# Patient Record
Sex: Female | Born: 1965 | Race: White | Hispanic: No | Marital: Single
Health system: Southern US, Community
[De-identification: ages and names within clinical notes are randomized; demographics above are authoritative.]

## PROBLEM LIST (undated history)

## (undated) DIAGNOSIS — F419 Anxiety disorder, unspecified: Secondary | ICD-10-CM

## (undated) DIAGNOSIS — E119 Type 2 diabetes mellitus without complications: Secondary | ICD-10-CM

## (undated) DIAGNOSIS — I10 Essential (primary) hypertension: Secondary | ICD-10-CM

## (undated) MED ORDER — HYDROCODONE-ACETAMINOPHEN 5 MG-325 MG TAB
5-325 mg | ORAL_TABLET | ORAL | Status: DC | PRN
Start: ? — End: 2013-04-09

## (undated) MED ORDER — NAPROXEN 500 MG TAB
500 mg | ORAL_TABLET | Freq: Two times a day (BID) | ORAL | Status: AC
Start: ? — End: 2013-04-19

## (undated) MED ORDER — OMEPRAZOLE 20 MG CAP, DELAYED RELEASE: 20 mg | ORAL_CAPSULE | Freq: Every day | ORAL | Status: AC

---

## 2012-06-18 NOTE — Procedures (Signed)
Study ID: [id number]                                                      [facility]                                                      [address] [place], [place] [address]                           Dobutamine Stress Echocardiogram Report       Name: Christine [name], Christine [name] Date: 06/19/2012 09:41 AM   MRN: [id number]                      Patient Location: ERO^EO05^EO05^C   DOB: [birth year]                  Age: 46 yrs   BP: 139/77 mmHg                  HR: 54   Gender: Female                   Account #: [id number]   Reason For Study: CHEST PAIN   Ordering Physician: Cipriano Mile, DAVID   Performed By: Dwyane Luo           Interpretation Summary   The Electrocardiographic Interpretation: negative by ECG criteria.   The Echocardiographic Interpretation: hyperkinetic wall motion.   The OverAll Impression : negative for ischemia .   _____________________________________________________________________________   __           Interpretation Summary       Stress Results             Protocol:  Dobutamine             Target HR: 150 bpm           Maximum Predicted HR: 176 bpm                          Stress Duration:   6:18 mm:ss *                      Maximum Stress HR: 153 bpm *       Stress Comments   A stress echocardiogram with Dobutamine was performed. The baseline ECG   displays sinus bradycardia. Arrhythmia induced during stress: occasional   PVC's. Electrocardiographic response was negative by ECG criteria. Test was   terminated due to target heart rate was achieved. Patient developed chest   pain, pain was non-limiting.. B/P Response to exercise: resting hypertension -   appropriate response.  I      WMSI = 1.00     % Normal = 100                                                                   REST                                                                    II      WMSI = 1.00     % Normal = 100                                                                   LOW DOSE                                                                   III      WMSI = 1.00     % Normal = 100                                                                   PEAK                                                                                                                               Segments  Size   X - Cannot                2 -                      4 -          1-2     small   Interpret    1 - Normal   Hypokinetic 3 - Akinetic Dyskinetic   3-5     moder     ate   5 -                                                             6-14    large   Aneurysmal                                                      15-16   diffu   se               Left Ventricle   The left ventricular chamber size at rest is normal. The left ventricular   chamber size at peak stress is smaller. Concentric hypertrophy is seen.       MEASUREMENTS/CALCULATIONS:               Electronically signed byDr. Lauree Chandler, MD   06/19/2012 10:17 AM

## 2012-06-18 NOTE — ED Provider Notes (Signed)
MEDICATION ADMINISTRATION SUMMARY         Drug Name: Mylanta, Dose Ordered: 30 mL , Route: Oral, Status: Given,         Time: 23:25 06/18/2012,         Drug Name: *Tylenol Caplet, Dose Ordered: 650 mg, Route: Oral,         Status: Given, Time: 23:10 06/18/2012,         Drug Name: Morphine Sulfate, Dose Ordered: 4 mg, Route: IV Push,         Status: Given, Time: 15:35 06/18/2012,         Drug Name: Aspirin, Dose Ordered: 325 mg, Route: Oral, Status: Given,         Time: 15:35 06/18/2012, *Additional information available in notes,         Detailed record available in Medication Service section.   KNOWN ALLERGIES   NKDA   OBS PROGRESS NOTE   OBS PROGRESS NOTE: OBS Progress Notes. Pt resting comfortably.         VSS. Denies chest pain. Cont CPP. (18:47 ADB2)     OBS Progress Notes. ce x 3 neg; continue cpp. jh. (21:06 JOHO)     OBS Progress Notes. enz neg x 3. 2/10 pain. no nursing issues per Shawnee Knapp,         Charity fundraiser. Cont CPP. tsn, md. (Sun Jun 19, 2012 04:10 TSN)     OBS Progress Notes. No chest pain, negative enzymes, vitals stable,         continue chest pain protocol. Wynelle Link Jun 19, 2012 05:23 NVS)   Domingo Dimes [phone] Jun 18, 2012 13:39 GCG1)   PATIENT: GENDER: female, DOB: Tue 12/16/1965, TIME OF GREET:         Sat Jun 18, 2012 13:38 by Guy Franco, RN, LANGUAGE: Mila Merry         WEIGHT: 158.8. (Sat Jun 18, 2012 13:39 GCG1)     NAME: Christine [name], Christine L. (14:04 JLB2)   ADMISSION: URGENCY: 2, TRANSPORT: Wheelchair, DEPT: Emergency,         BED: WAITING. (Sat Jun 18, 2012 13:39 GCG1)   COMPLAINT:  Chest Pain. (Sat Jun 18, 2012 13:39 GCG1)   PRESENTING COMPLAINT:  L sided chest pain since 2 hours pta with         shortness of breath; headache. (13:49 GCG1)   PAIN: Patient complains of pain, Pain described as stabbing, On a         scale 0-10 patient rates pain as 8, Pain is constant. (13:49 GCG1)   IMMUNIZATIONS:  Immunizations up to date. (13:49 GCG1)   TREATMENT PRIOR TO ARRIVAL: None. (13:49 GCG1)    TB SCREENING: TB screen not applicable for this patient. (13:49         GCG1)   ABUSE SCREENING: Patient denies physical abuse or threats. (13:49         GCG1)   FALL RISK: Patient has a low risk of falling, Patient has no         history of falling (0), No secondary diagnosis (0), None/bed         rest/nurse assist (0), No IV or IV access (0), Normal/bed         rest/wheelchair (0), Oriented to own ability (0), Total 0. (13:49         GCG1)   SUICIDAL IDEATION: Suicidal ideation is not present. (13:49 GCG1)   ADVANCE DIRECTIVES: Patient does not have advance directives.         (  13:49 GCG1)   PROVIDERS: TRIAGE NURSE: Guy Franco, RN. (Sat Jun 18, 2012         13:39 GCG1)   PRESENTING PROBLEM (13:39 GCG1)      Presenting problems: Chest Pain - Adult.   GREET (13:38 GCG1)   GREET: Greet: Sat Jun 18, 2012 13:38.   CURRENT MEDICATIONS (13:49 GCG1)   Patient not taking meds   MEDICATION SERVICE   Aspirin:  Order: Aspirin - Dose: 325 mg : Oral         Ordered by: Gretchen Portela, PA-C         Entered by: Gretchen Portela, PA-C Sat Jun 18, 2012 15:16 ,         Acknowledged by: Guy Franco, RN Sat Jun 18, 2012 15:24         Documented as given by: Guy Franco, RN Sat Jun 18, 2012 15:35         Patient, Medication, Dose, Route and Time verified prior to         administration.          Amount given: 325 mg, Site: Medication administered P.O., Correct         patient, time, route, dose and medication confirmed prior to         administration, Patient advised of actions and side-effects prior to         administration, Allergies confirmed and medications reviewed prior to         administration.   Morphine Sulfate:  Order: Morphine Sulfate - Dose: 4 mg         : IV Push         Ordered by: Gretchen Portela, PA-C         Entered by: Gretchen Portela, PA-C Sat Jun 18, 2012 15:16 ,         Acknowledged by: Guy Franco, RN Sat Jun 18, 2012 15:24         Documented as given by: Guy Franco, RN Sat Jun 18, 2012 15:35          Patient, Medication, Dose, Route and Time verified prior to         administration.          Amount given: 4 mg, IV SITE #1 into left antecubital, IV SITE #1         IVP, initial medication, Slowly, Connections checked prior to         administration, Line traced prior to administration, Catheter         placement confirmed via flush prior to administration, IV site         without signs or symptoms of infiltration during medication         administration, No swelling during administration, No drainage during         administration, IV flushed after administration, Correct patient,         time, route, dose and medication confirmed prior to administration,         Patient advised of actions and side-effects prior to administration,         Allergies confirmed and medications reviewed prior to administration,         Patient in position of comfort, Side rails up, Cart in lowest         position, Family at bedside.   Morphine Sulfate:  Decreased pain, _IV SITE #1:_, Pain: 2. (15:59         GCG1)   Mylanta:  Order: Mylanta (Aluminum Hydroxide/Magnesium         Hydroxide/Simethicone) - Dose: 30 mL : Oral         Ordered by: Hilaria Ota, PA-C         Entered by: Hilaria Ota, PA-C Sat Jun 18, 2012 22:49 ,         Acknowledged by: Blane Ohara, RN Sat Jun 18, 2012 23:10         Documented as given by: Blane Ohara, RN Sat Jun 18, 2012 23:25         Patient, Medication, Dose, Route and Time verified prior to         administration.          Amount given: 30ml, Site: Medication administered P.O., Correct         patient, time, route, dose and medication confirmed prior to         administration, Patient advised of actions and side-effects prior to         administration, Allergies confirmed and medications reviewed prior to         administration, Patient in position of comfort, Side rails up, Cart         in lowest position, Family at bedside, Call light in reach.    : Follow Up :  Decreased symptoms. Wynelle Link Jun 19, 2012 00:16 PNL1)   Tylenol Caplet:  Order: Tylenol Caplet (Acetaminophen) -         Dose: 650 mg : Oral         Notes: Every 4 hours PRN for headache         Ordered by: Verlin Grills, PA-C         Entered by: Verlin Grills, PA-C Sat Jun 18, 2012 22:28 ,         Acknowledged by: Blane Ohara, RN Sat Jun 18, 2012 22:29         Documented as given by: Blane Ohara, RN Sat Jun 18, 2012 23:10         Patient, Medication, Dose, Route and Time verified prior to         administration.          Amount given: 650mg , Site: Medication administered P.O., Correct         patient, time, route, dose and medication confirmed prior to         administration, Patient advised of actions and side-effects prior to         administration, Allergies confirmed and medications reviewed prior to         administration.   ORDERS   12 LEAD EKG:  Ordered for: Carmela Hurt, MD, Ben         Status: Active. (13:49 GCG1)   BASIC METABOLIC PANEL:  Ordered for: Carmela Hurt, MD, Ben         Status: Canceled by: Ephriam Knuckles RNNatalia Leatherwood - Sat Jun 18, 2012         14:11. (13:49 GCG1)   BP Monitor:  Ordered for: Carmela Hurt, MD, Ben         Status: Done by: Felipa Furnace RNRivka Barbara - Sat Jun 18, 2012 13:50. (13:49         GCG1)   Cardiac Monitor:  Ordered for: Carmela Hurt, MD, Ben         Status: Done by: Felipa Furnace RN, Glenda - Sat Jun 18, 2012 13:50. (13:49         GCG1)   CBC, AUTOMATED DIFFERENTIAL:  Ordered for: Carmela Hurt,  MD, Romeo Apple         Status: Canceled by: Ephriam Knuckles RNNatalia Leatherwood - Sat Jun 18, 2012         14:11. (13:49 GCG1)   CPK PROFILE:  Ordered for: Carmela Hurt, MD, Ben         Status: Canceled by: Ephriam Knuckles RNNatalia Leatherwood - Sat Jun 18, 2012         14:11. (13:49 GCG1)   IV- Saline Lock:  Ordered for: Carmela Hurt, MD, Ben         Status: Done by: Jerelyn Scott - Sat Jun 18, 2012 14:05. (13:49         GCG1)   MYOGLOBIN (BLOOD):  Ordered for: Carmela Hurt, MD, Ben          Status: Canceled by: Ephriam Knuckles RNNatalia Leatherwood - Sat Jun 18, 2012         14:11. (13:49 GCG1)   O2 sat Monitor:  Ordered for: Carmela Hurt, MD, Ben         Status: Done by: Felipa Furnace RN, Rivka Barbara - Sat Jun 18, 2012 13:50. (13:49         GCG1)   TROPONIN I:  Ordered for: Carmela Hurt, MD, Ben         Status: Canceled by: Ephriam Knuckles RNNatalia Leatherwood - Sat Jun 18, 2012         14:12. (13:49 GCG1)   BASIC METABOLIC PANEL:  Ordered for: Cipriano Mile, MD, David         Status: Done by: System - Sat Jun 18, 2012 14:51. (14:13 ADB2)   CBC, AUTOMATED DIFFERENTIAL:  Ordered for: Cipriano Mile, MD, David         Status: Done by: System - Sat Jun 18, 2012 14:44. (14:13 ADB2)   CHEST 2 VIEWS:  Ordered for: Cipriano Mile, MD, Onalee Hua         Status: Active. (14:13 ADB2)   CPK PROFILE:  Ordered for: Cipriano Mile, MD, David         Status: Done by: System - Sat Jun 18, 2012 14:55. (14:13 ADB2)   D-DIMER:  Ordered forCipriano Mile, MD, David         Status: Done by: System - Sat Jun 18, 2012 14:51. (14:13 ADB2)   TROPONIN I:  Ordered for: Cipriano Mile, MD, David         Status: Done by: System - Sat Jun 18, 2012 14:55. (14:13 ADB2)   BP Cuff Adult Large:  Ordered for: Cipriano Mile, MD, Onalee Hua         Status: Active. (14:17 NLL0)   CONTINUOUS PULSE OX:  Ordered for: Cipriano Mile, MD, Onalee Hua         Status: Active. (14:17 NLL0)   IV Start kit:  Ordered for: Cipriano Mile, MD, Onalee Hua         Status: Active. (14:17 NLL0)   MONITOR ELECTRODE:  Ordered for: Cipriano Mile, MD, Onalee Hua         Status: Active. (14:17 NLL0)   Elita Boone IV Cath:  Ordered for: Pitrolo, MD, Onalee Hua         Status: Active. (14:17 NLL0)   Urine dip (send for lab U/A if positive):  Ordered for: Cipriano Mile,         MD, Onalee Hua         Status: Done by: Jerelyn Scott - Sat Jun 18, 2012 14:17. (14:17         NLL0)   Urine HCG:  Ordered for: Cipriano Mile, MD, Onalee Hua         Status: Done by: Nedra Hai  Otho Najjar - ZOX Jun 18, 2012 14:18. (14:17         NLL0)   ED OBSERVATION for CEP:  Ordered for: Cipriano Mile, MD, Onalee Hua          Status: Done by: Mariane Duval - Sat Jun 18, 2012 15:34. (15:31         ADB2)   Bed rest with BRP:  Ordered for: Cipriano Mile, MD, Onalee Hua         Status: Done by: Enrigue Catena, RN, Doreene Burke Jun 19, 2012 02:12. (15:32         ADB2)   Cardiac Diet, NPO after 0200 for Stress Test in AM:  Ordered for:         Cipriano Mile, MD, Onalee Hua         Status: Done by: Enrigue Catena, RN, Doreene Burke Jun 19, 2012 02:12. (15:32         ADB2)   Continuous Cardiac Monitoring:  Ordered for: Cipriano Mile, MD, Onalee Hua         Status: Done by: Enrigue Catena, RN, Perri - Wynelle Link Jun 19, 2012 02:12. (15:32         ADB2)   Draw repeat cardiac enzymes (CPK, Trop) at: 3 and 6 hrs:          Ordered for: Cipriano Mile, MD, Onalee Hua         Status: Done by: Enrigue Catena RN, Perri - Sat Jun 18, 2012 21:17. (15:32         ADB2)   IV Saline lock - Flush per hospital policy:  Ordered for:         Cipriano Mile, MD, Onalee Hua         Status: Done byEnrigue Catena, RN, Doreene Burke Jun 19, 2012 02:12. (15:32         ADB2)   May take meds per Med Recon:  Ordered for: Cipriano Mile, MD, Onalee Hua         Status: Done by: Enrigue Catena, RN, Perri - Wynelle Link Jun 19, 2012 02:12. (15:32         ADB2)   Off monitor when out for testing:  Ordered for: Cipriano Mile, MD,         Onalee Hua         Status: Active. (15:32 ADB2)   Order Stress Test. Dobutamine stress:  Ordered for: Cipriano Mile, MD,         Onalee Hua         Status: Active. (15:32 ADB2)   Please Notify MD/PA of any enzyme abnormalities:  Ordered for:         Cipriano Mile, MD, Onalee Hua         Status: Done by: Enrigue Catena, RN, Perri - Sun Jun 19, 2012 02:13. (15:32         ADB2)   Pt to receive CP packet and view video:  Ordered for: Cipriano Mile,         MD, Onalee Hua         Status: Active. (15:32 ADB2)   Vital Signs Q 4 hours while awake:  Ordered for: Pitrolo, MD,         Onalee Hua         Status: Active. (15:32 ADB2)   CPK PROFILE:  Ordered for: Cipriano Mile, MD, Onalee Hua         Status: Canceled by: Malva Limes - Sat Jun 18, 2012         16:04. (16:00 JMB1)   TROPONIN I:  Ordered for: Cipriano Mile, MD, Onalee Hua  Status: Canceled by: Malva Limes - Sat Jun 18, 2012         16:04. (16:00 JMB1)   CPK PROFILE:  Ordered for: Cipriano Mile, MD, Onalee Hua         Status: Done by: System - Sat Jun 18, 2012 17:49. (16:05 JMB1)   TROPONIN I:  Ordered for: Cipriano Mile, MD, David         Status: Done by: System - Sat Jun 18, 2012 17:50. (16:05 JMB1)   CPK PROFILE:  Ordered for: Cipriano Mile, MD, Onalee Hua         Status: Done by: System - Sat Jun 18, 2012 20:59. (20:21 JMB1)   CPK PROFILE:  Ordered for: Sherlon Handing, M.D., Christiane Ha         Status: Canceled by: Sheran Luz - Sat Jun 18, 2012 20:25.         (20:21 DMS1)   TROPONIN I:  Ordered for: Cipriano Mile, MD, Onalee Hua         Status: Done by: System - Sat Jun 18, 2012 20:59. (20:21 JMB1)   TROPONIN I:  Ordered for: Sherlon Handing, M.D., Christiane Ha         Status: Canceled by: Sheran Luz - Sat Jun 18, 2012 20:26.         (20:21 DMS1)   pt can use her own CPAP machine from home:  Ordered for: Sherlon Handing,         M.D., Christiane Ha         Status: Done by: Enrigue Catena RN, Perri - Sat Jun 18, 2012 21:17. (20:39         JDW)   Discharge patient to home:  Ordered for: Cipriano Mile, MD, Onalee Hua         Status: Active. Wynelle Link Jun 19, 2012 11:45 MMA)   NURSING ASSESSMENT: CV WITH PROCEDURES (14:19 GCG1)   CONSTITUTIONAL: History obtained from patient, Patient arrives,         via hospital wheelchair, Gait steady, Patient appears, anxious,         Patient cooperative, Patient alert, Oriented to person, place and         time, Skin warm, Skin dry, Skin normal in color, Mucous membranes         pink, Mucous membranes moist, Patient, poorly groomed.   PAIN: to the left chest, describes as squeezing pain, Onset of         pain 2 hours pta, constant, on a scale 0-10 patient rates pain as 8,         Pain exacerbated by, deep breathing, Nothing has been tried to         alleviate the pain.   CARDIOVASCULAR: Cardiovascular assessment findings include heart          rate normal, Heart rhythm, sinus bradycardia, Heart sounds normal,         Bilateral blood pressures equal, Associated with dyspnea, Associated         with dizziness, No history of pulmonary embolism, No history of DVT         or leg swelling.   CARDIAC MONITOR: Patient placed on cardiac monitor, Heart rate:         52, showing sinus bradycardia, without ectopy, Patient placed on         non-invasive blood pressure monitor, Patient placed on continuous         pulse oximetry.   OBSERVATION ASSESSMENT: CARDIOVASCULAR Wynelle Link Jun 19, 2012 08:44 [id number])   CARDIOVASCULAR: Cardiovascular assessment findings  include heart         rate normal, Heart rhythm normal sinus, Notes: Heart rate is S.Huston Foley.   RESPIRATORY: Respirations regular, Notes: Pt uses c-pap.   NURSING PROCEDURE: ADMISSION (16:52 GCG1)   ADMISSION: Report called to, Saidie, RN Ed Obs, Provided         opportunity to answer questions, Admission orders received and         completed, Acuity level urgent, Transported via wheelchair,         Accompanied by emergency department technician, Transported with         disposable blood pressure cuff, Transported with saline lock.   NURSING PROCEDURE: DISCHARGE NOTE Wynelle Link Jun 19, 2012 13:20 SWG0)   DISCHARGE: Patient discharged to home, ambulating without         assistance, family driving, accompanied by other family member,         Summary of Care printed/ provided, Simple or moderate discharge         teaching performed.   NURSING PROCEDURE: EKG CHART (14:23 ARK1)   PATIENT IDENTIFIER: Patient's identity verified by patient         stating name, Patient's identity verified by patient stating birth         date, Patient's identity verified by hospital ID bracelet, Patient         actively involved in identification process.   FOLLOW-UP: After procedure, EKG for interpretation given to Dr.         Cipriano Mile, EKG was given to Dr. at 1341.   NOTES: Patient tolerated procedure well.   NURSING PROCEDURE: IV (14:17 NLL0)    PATIENT IDENITIFIER: Patient's identity verified by patient         stating name, Patient's identity verified by patient stating birth         date, Patient's identity verified by hospital ID bracelet.   IV SITE 1: IV therapy indicated for hydration, IV established, to         the left antecubital, using a 20 gauge catheter, in one attempt,         Saline lock established, Flushed with normal saline (mls): 10, Labs         drawn at time of placement, labeled in the presence of the patient         and sent to lab, Labs drawn at 1416, Tourniquet removed from patient         after procedure., Labs labeled in the presence of the patient and         then sent to the Lab.   FOLLOW-UP SITE 1: After procedure, sterile transparent dressing         applied.   SAFETY: Side rails up, Cart/Stretcher in lowest position, Family         at bedside, Call light within reach, Hospital ID band on.   NURSING PROCEDURE: TRANSPORT TO TESTS (15:09 CSG2)   PATIENT IDENTIFIER: Patient's identity verified by patient         stating name, Patient's identity verified by patient stating birth         date, Patient's identity verified by hospital ID bracelet.   TRANSPORT TO TESTS: Patient transported to x-ray, via cart,         Accompanied by x-ray technician.   FOLLOW-UP: After procedure, patient returned to emergency         department.   OBSERVATION PROCEDURE: CARDIAC MONITOR   CARDIAC MONITOR: Patient placed on cardiac monitor, Heart  rate:         56, showing sinus bradycardia. (19:48 PNL1)     Patient placed on cardiac monitor, Heart rate: 74, in bigeminy. Wynelle Link Jun 19, 2012 04:52 PNL1)   OBSERVATION PROCEDURE: LAB DRAW   PATIENT IDENTIFIER: Patient's identity verified by patient         stating name, Patient's identity verified by patient stating birth         date, Patient's identity verified by hospital ID bracelet, Patient         actively involved in identification process. (17:30 JMB1)      Patient's identity verified by patient stating name, Patient's identity         verified by patient stating birth date, Patient's identity verified         by hospital ID bracelet. (20:36 DMS1)   LAB DRAW: Subsequent lab draw performed, from vascular access         device, existing IV site, 20g lt ac, After labs drawn, device flushed         with saline, amount (mL) 8, Lab specimens labeled in the presence of         the patient and sent to lab. (17:30 JMB1)     Subsequent lab draw performed, from vascular access device, existing IV         site, site 1 left ac, After labs drawn, device flushed with saline,         amount (mL) 10 cc, Lab specimens labeled in the presence of the         patient and sent to lab, Tourniquet removed from patient after         procedure. (20:36 DMS1)   OBSERVATION PROCEDURE: NEW PATIENT FLOWSHEET Wynelle Link Jun 19, 2012 13:17 [id number])   PATIENT IDENTIFIER: Patient's identity verified by patient         stating name, Patient's identity verified by patient stating birth         date.   PATIENT: Report received from [employer], Report received at [facility],         Educational video viewed, Patient viewed education video for asthma.   VITAL SIGNS: BP: 134, / 68, BP: (Lying), Pulse: 106, Resp: 24,         Temp: 97.5, (Oral), Pain: 7, O2 sat: 97, on 2L O2 NC.   OBSERVATION PROCEDURE: NURSE NOTES   NURSE NOTES: Notes: Pt. admitted to ED Obs 5. Cardiac monitor         applied. (18:29 GHT0)     Patient in no apparent distress, Notes: using cpap machine, denies chest         pain. (21:38 PNL1)     Notes: Daughter, Reuel Boom, sent home c family, Drue Stager [phone]. Wynelle Link Jun 19, 2012 00:13 PNL1)     Patient in no apparent distress, Patient resting quietly, Notes: sinus         bradicardia on moniot. Wynelle Link Jun 19, 2012 06:55 PNL1)     Shift change report given, to [facility], Provided opportunity to answer         questions. Wynelle Link Jun 19, 2012 07:30 PNL1)      Notes: Assumed care of pt. Lying in bed with c-pap on. Denies chest pain.         Wynelle Link Jun 19, 2012 08:42 SWG0)   OBSERVATION PROCEDURE: TRANSPORT TO TEST   PATIENT  IDENTIFIER: Patient's identity verified by patient         stating name, Patient's identity verified by patient stating birth         date, Patient's identity verified by hospital ID bracelet, Patient         actively involved in identification process. Wynelle Link Jun 19, 2012 10:09         RLA0)   TRANSPORT TO TEST: Patient transport to cardiology, via         wheelchair, Accompanied by a Nurse. Wynelle Link Jun 19, 2012 09:40 [id number])   FOLLOW-UP: After procedure, patient returned to ED Observation         department, Notes: pt placed back on monitor, vs taken, meal given.         Wynelle Link Jun 19, 2012 10:09 RLA0)   SAFETY: Notes: To dobutamine test. Wynelle Link Jun 19, 2012 09:40 SWG0)   VITAL SIGNS: BP: 121, / 61, BP: (Lying), Pulse: 72, Resp: 18,         Temp: 97.5, (Oral), Pain: 0, O2 sat: 97, on Room air. Wynelle Link Jun 19, 2012 10:10 RLA0)   DIAGNOSIS (15:31 ADB2)   FINAL: PRIMARY: Chest pain - precordial.   DISPOSITION   PATIENT:  Disposition Type: Observation, Disposition: Chest Pain         Observation, Condition: Stable. (15:31 ADB2)      Patient left the department. Wynelle Link Jun 19, 2012 13:20 SWG0)   VITAL SIGNS   VITAL SIGNS: BP: 147/66 (Sitting), Pulse: 52, Resp: 12, Temp:         98.1 (Oral), Pain: 8, O2 sat: 99 on Room air, Time: 06/18/2012 13:46.         (13:46 GCG1)     BP: 141/72 (Sitting), Pulse: 50, Resp: 14, Pain: 8, O2 sat: 98 on Room         air, Time: 06/18/2012 14:30. (14:58 GCG1)     BP: 115/43 (Sitting), Pulse: 56, Resp: 16, Pain: 6, O2 sat: 100 on Room         air, Time: 06/18/2012 15:30. (15:36 GCG1)     Pain: 2, Time: 06/18/2012 15:59. (15:59 GCG1)     BP: 112/51 (Sitting), Pulse: 54, Resp: 16, Pain: 2, O2 sat: 98 on Room         air, Time: 06/18/2012 16:30. (16:52 GCG1)     BP: 143/77 (Lying), Pulse: 46, Resp: 20, Temp: 97.6 (Oral), Pain: 2, O2          sat: 99 on Room air, Time: 06/18/2012 17:11. (17:12 JMB1)     BP: 117/58 (Sitting), Pulse: 57, Resp: 18, Temp: 97.8 (Oral), O2 sat: 97         on Room air, Time: 06/19/2012 00:01. Wynelle Link Jun 19, 2012 00:01 DMS1)     BP: 116/73 (Lying), Pulse: 80, Resp: 16, Temp: 98.1 (Oral), Pain: 0, O2         sat: 98 on On CPAP, Time: 06/19/2012 05:32. Wynelle Link Jun 19, 2012 05:32         PNL1)     BP: 121/61 (Lying), Pulse: 72, Resp: 18, Temp: 97.5 (Oral), Pain: 0, O2         sat: 97 on Room air, Time: 06/19/2012 10:09. Wynelle Link Jun 19, 2012 10:10         RLA0)     BP: 134/68 (Lying), Pulse: 106, Resp: 24, Temp: 97.5 (Oral), Pain: 7, O2         sat:  97 on 2L O2 NC, Time: 06/19/2012 13:00. Wynelle Link Jun 19, 2012 13:17         ZOX0)   INSTRUCTION Wynelle Link Jun 19, 2012 11:46 MMA)   DISCHARGE:  CHEST PAIN OF UNCLEAR ETIOLOGY.   [facility], [address] [place]         [address] [place] [address], [phone].   SPECIAL:  Follow up with primary care physician within one week.         Tylenol or Ibuprofen for pain, Mylanta or Zantac for heartburn.         Return to the ER if condition worsens or new symptoms develop.   EVENTS   TRANSFER:  Triage to Emergency Waiting. (13:39 GCG1)      Emergency Waiting to Main 31. (13:47 GCG1)      Emergency Main 31 to Observation Obs waiting. (16:53 GCG1)      Observation Obs waiting to Observation 05. (17:11 JMB1)      Removed from Observation Observation 05. Wynelle Link Jun 19, 2012 13:20 [id number])   PRESCRIPTION     No recorded prescriptions   ADMIN   DIGITAL SIGNATURE:  Felipa Furnace, RN, Rivka Barbara. (16:52 GCG1)      Peyton Bottoms, RN, Cordelia Pen. Wynelle Link Jun 19, 2012 13:18 [id number])   Key:     ADB2=Blalock, PA-C, Morrie Sheldon ARK1=King, ACT III, Angela COL1=OLeary, PA-C,     Colleen     CSG2=Guise, RAD TECH, Chris DMS1=Snak, ACT III, Lawana Chambers, RN,     Rivka Barbara     GHT0=Taylor, RN, Archbold JDW=Womack, PA-C, Shanda Bumps JLB2=Booker, Jamie     JMB1=Burdick, ACT III, [employer] JOHO=Hubbard, PA-C, [employer],     PA-C,      [employer]     NLL0=[employer] Lpn, [employer] NVS=Santiago, PA-C, [employer] PNL1=Legler, RN, Perri     RLA0=Allen, ACT III, Rachael SWG0=Browder, RN, Cordelia Pen TSN=Nelson, MD, Jorja Loa

## 2012-06-18 NOTE — Procedures (Signed)
Study ID: [id number]                                                      [facility]                                                      [address]                                                       [place], [place] [address]                           Dobutamine Stress Echocardiogram Report       Name: [name], Christine L            Study Date: 06/19/2012 09:41 AM   MRN: [id number]                      Patient Location: ERO^EO05^EO05^C   DOB: 01/03/1966                  Age: 46 yrs   BP: 139/77 mmHg                  HR: 54   Gender: Female                   Account #: [id number]   Reason For Study: CHEST PAIN   Ordering Physician: PITROLO, DAVID   Performed By: Lawhon, Alicia N           Interpretation Summary   The Electrocardiographic Interpretation: negative by ECG criteria.   The Echocardiographic Interpretation: hyperkinetic wall motion.   The OverAll Impression : negative for ischemia .   _____________________________________________________________________________   __           Interpretation Summary       Stress Results             Protocol:  Dobutamine             Target HR: 150 bpm           Maximum Predicted HR: 176 bpm                          Stress Duration:   6:18 mm:ss *                      Maximum Stress HR: 153 bpm *       Stress Comments   A stress echocardiogram with Dobutamine was performed. The baseline ECG   displays sinus bradycardia. Arrhythmia induced during stress: occasional   PVC's. Electrocardiographic response was negative by ECG criteria. Test was   terminated due to target heart rate was achieved. Patient developed chest   pain, pain was non-limiting.. B/P Response to exercise: resting hypertension -   appropriate response.             I      WMSI = 1.00     % Normal = 100                                                                   REST                                                                    II      WMSI = 1.00     % Normal = 100                                                                   LOW DOSE                                                                   III      WMSI = 1.00     % Normal = 100                                                                   PEAK                                                                                                                               Segments  Size   X - Cannot                2 -                      4 -          1-2     small   Interpret    1 - Normal   Hypokinetic 3 - Akinetic Dyskinetic   3-5     moder     ate   5 -                                                             6-14    large   Aneurysmal                                                      15-16   diffu   se               Left Ventricle   The left ventricular chamber size at rest is normal. The left ventricular   chamber size at peak stress is smaller. Concentric hypertrophy is seen.       MEASUREMENTS/CALCULATIONS:               Electronically signed byDr. Joseph Sposato, MD   06/19/2012 10:17 AM

## 2012-10-28 MED ADMIN — HYDROcodone-acetaminophen (NORCO) 5-325 mg per tablet 1 Tab: ORAL | @ 17:00:00 | NDC 68084036811

## 2012-10-28 MED FILL — HYDROCODONE-ACETAMINOPHEN 5 MG-325 MG TAB: 5-325 mg | ORAL | Qty: 1

## 2012-10-28 NOTE — ED Notes (Signed)
I have reviewed discharge instructions with the patient.  The patient verbalized understanding.    Patient armband removed and given to patient to take home.  Patient was informed of the privacy risks if armband lost or stolen    The patient Christine [name] is a 46 y.o. female who  has no past medical history on file..  She   The patient's medications were reviewed and discussed prior to discharge. The patient was very interactive and did understand their medications.    The following medications were discussed in details with the patient. The patient said they [facility].      [email or web address] @    Darrik Richman Benay Pike, RN    MyChart Activation    Thank you for requesting access to MyChart. Please follow the instructions below to securely access and download your online medical record. MyChart allows you to send messages to your doctor, view your test results, renew your prescriptions, schedule appointments, and more.    How Do I Sign Up?    1. In your internet browser, go to [email or web address]  2. Click on the First Time User? Click Here link in the Sign In box. You will be redirect to the New Member Sign Up page.  3. Enter your MyChart Access Code exactly as it appears below. You will not need to use this code after you???ve completed the sign-up process. If you do not sign up before the expiration date, you must request a new code.    MyChart Access Code: @[email or web address] @ (This is the date your MyChart access code will expire)    4. Enter the last four digits of your Social Security Number (xxxx) and Date of Birth (mm/dd/yyyy) as indicated and click Submit. You will be taken to the next sign-up page.  5. Create a MyChart ID. This will be your MyChart login ID and cannot be changed, so think of one that is secure and easy to remember.  6. Create a MyChart password. You can change your password at any time.  7. Enter your Password Reset Question and Answer. This can be used at a later time  if you forget your password.   8. Enter your e-mail address. You will receive e-mail notification when new information is available in MyChart.  9. Click Sign Up. You can now view and download portions of your medical record.  10. Click the Download Summary menu link to download a portable copy of your medical information.    Additional Information    If you have questions, please visit the Frequently Asked Questions section of the MyChart website at [email or web address] Remember, MyChart is NOT to be used for urgent needs. For medical emergencies, dial 911.

## 2012-10-28 NOTE — ED Provider Notes (Signed)
I was personally available for consultation in the emergency department. I have reviewed the chart prior to the patient's discharge and agree with the documentation recorded by the MLP, including the assessment, treatment plan, and disposition.

## 2012-10-28 NOTE — ED Provider Notes (Addendum)
HPI Comments: 46 y.o. female presents to ED c/o pain to bilateral knees and middle of the back s/p fall prior to arrival. Pt was walking down the back steps when the steps gave way and she fell forward, landing on her knees. Denies head trauma or LOC. Medic states she ambulated to the stretcher on scene. Pt has pain with bending of bilateral knees. Abrasion and swelling to left knee. No numbness, weakness, bowel or bladder dysfunction. Otherwise feels well.           The history is provided by the patient.        No past medical history on file.     No past surgical history on file.      No family history on file.     History     Social History   ??? Marital Status: DIVORCED     Spouse Name: N/A     Number of Children: N/A   ??? Years of Education: N/A     Occupational History   ??? Not on file.     Social History Main Topics   ??? Smoking status: Not on file   ??? Smokeless tobacco: Not on file   ??? Alcohol Use: Not on file   ??? Drug Use: Not on file   ??? Sexually Active: Not on file     Other Topics Concern   ??? Not on file     Social History Narrative   ??? No narrative on file                  ALLERGIES: Review of patient's allergies indicates no known allergies.      Review of Systems   Constitutional: Negative for fever, chills, diaphoresis and fatigue.   HENT: Negative.    Respiratory: Negative.    Cardiovascular: Negative.    Genitourinary: Negative.    Musculoskeletal: Positive for back pain and arthralgias.   Skin: Negative.    Neurological: Negative for weakness, light-headedness, numbness and headaches.   All other systems reviewed and are negative.        Filed Vitals:    10/28/12 1029   BP: 134/84   Pulse: 84   Temp: 98.8 ??F (37.1 ??C)   Resp: 16   SpO2: 99%            Physical Exam   Nursing note and vitals reviewed.  Constitutional: She is oriented to person, place, and time. She appears well-developed. No distress.   HENT:   Head: Normocephalic and atraumatic.   Mouth/Throat: Oropharynx is clear and moist.   Eyes:  Conjunctivae and EOM are normal. Pupils are equal, round, and reactive to light.   Neck: Normal range of motion. Neck supple.   Cardiovascular: Normal rate, regular rhythm, normal heart sounds and intact distal pulses.    DP/PT pulses 2+ bilaterally, cap refill < 2 sec.   Pulmonary/Chest: Effort normal and breath sounds normal.   Musculoskeletal:   Back: No warmth, erythema, edema, ecchymosis, skin rash or open wounds appreciated. Tenderness across mid thoracic spine and paraspinal musculature. No crepitus. FROM, slow moving through positions. No obvious deformities noted. Ambulatory, slightly antalgic gait.  Knees: right knee has pain with ROM. Diffuse mild swelling. No signs of trauma. No crepitus. No focal bony tenderness. No joint laxity.   Left knee has pain with ROM, diffuse moderate swelling, abrasion over patella. No laceration. No crepitus. Tenderness to medial joint line. No joint laxity.    Neurological: She is  alert and oriented to person, place, and time.   Skin: Skin is warm and dry. She is not diaphoretic.        MDM    Procedures    Pt ambulated to and from restroom several times during visit. No acute fracture on films. Will follow up with ortho as needed. Rest, ice, elevation, ace, crutches. Discussed with patient and any family present. Answered all questions. Patient and/or family understand and agree with plan.

## 2012-10-28 NOTE — ED Notes (Signed)
Patient complains of severe back pain between the shoulder blades. Left knee pain . Crying . States was unable to walk at scene of fall.

## 2012-10-28 NOTE — ED Notes (Signed)
She was walking and fell on top of the steps and roll. C/o right leg pain and left knee pain.

## 2013-04-09 LAB — URINALYSIS W/ RFLX MICROSCOPIC
Glucose: NEGATIVE mg/dL
Ketone: NEGATIVE mg/dL
Leukocyte Esterase: NEGATIVE
Nitrites: NEGATIVE
Protein: NEGATIVE mg/dL
Specific gravity: >1.03 — ABNORMAL HIGH (ref 1.003–1.030)
Urobilinogen: 0.2 EU/dL (ref 0.2–1.0)
pH (UA): 5.5 (ref 5.0–8.0)

## 2013-04-09 LAB — HEPATIC FUNCTION PANEL
A-G Ratio: 1 (ref 0.8–1.7)
ALT (SGPT): 20 U/L (ref 12.0–78.0)
AST (SGOT): 18 U/L (ref 15–37)
Albumin: 3.6 g/dL (ref 3.4–5.0)
Alk. phosphatase: 97 U/L (ref 45–117)
Bilirubin, direct: 0.3 MG/DL — ABNORMAL HIGH (ref 0.0–0.2)
Bilirubin, total: 1 MG/DL (ref 0.2–1.0)
Globulin: 3.5 g/dL (ref 2.0–4.0)
Protein, total: 7.1 g/dL (ref 6.4–8.2)

## 2013-04-09 LAB — CBC WITH AUTOMATED DIFF
ABS. BASOPHILS: 0 10*3/uL (ref 0.0–0.06)
ABS. EOSINOPHILS: 0.1 10*3/uL (ref 0.0–0.4)
ABS. LYMPHOCYTES: 1.2 10*3/uL (ref 0.9–3.6)
ABS. MONOCYTES: 0.5 10*3/uL (ref 0.05–1.2)
ABS. NEUTROPHILS: 4.6 10*3/uL (ref 1.8–8.0)
BASOPHILS: 0 % (ref 0–2)
EOSINOPHILS: 2 % (ref 0–5)
HCT: 32.2 % — ABNORMAL LOW (ref 35.0–45.0)
HGB: 9.7 g/dL — ABNORMAL LOW (ref 12.0–16.0)
LYMPHOCYTES: 19 % — ABNORMAL LOW (ref 21–52)
MCH: 23.2 PG — ABNORMAL LOW (ref 24.0–34.0)
MCHC: 30.1 g/dL — ABNORMAL LOW (ref 31.0–37.0)
MCV: 76.8 FL (ref 74.0–97.0)
MONOCYTES: 8 % (ref 3–10)
MPV: 11.4 FL (ref 9.2–11.8)
NEUTROPHILS: 71 % (ref 40–73)
PLATELET: 260 10*3/uL (ref 135–420)
RBC: 4.19 M/uL — ABNORMAL LOW (ref 4.20–5.30)
RDW: 14.9 % — ABNORMAL HIGH (ref 11.6–14.5)
WBC: 6.5 10*3/uL (ref 4.6–13.2)

## 2013-04-09 LAB — METABOLIC PANEL, BASIC
Anion gap: 6 mmol/L (ref 3.0–18)
BUN/Creatinine ratio: 14 (ref 12–20)
BUN: 14 MG/DL (ref 7.0–18)
CO2: 27 mmol/L (ref 21–32)
Calcium: 8.6 MG/DL (ref 8.5–10.1)
Chloride: 112 mmol/L — ABNORMAL HIGH (ref 100–108)
Creatinine: 0.98 MG/DL (ref 0.6–1.3)
GFR est AA: >60 mL/min/{1.73_m2} (ref >60)
GFR est non-AA: >60 mL/min/{1.73_m2} (ref >60)
Glucose: 80 mg/dL (ref 74–99)
Potassium: 4.2 mmol/L (ref 3.5–5.5)
Sodium: 145 mmol/L (ref 136–145)

## 2013-04-09 LAB — PTT: aPTT: 26.1 s (ref 24.6–37.7)

## 2013-04-09 LAB — URINE MICROSCOPIC ONLY
Bacteria: NEGATIVE /hpf
RBC: 0 /hpf (ref 0–5)
WBC: 0 /hpf (ref 0–4)

## 2013-04-09 LAB — CARDIAC PANEL,(CK, CKMB & TROPONIN)
CK - MB: 4.2 ng/ml — ABNORMAL HIGH (ref 0.5–3.6)
CK-MB Index: 2.1 % (ref 0.0–4.0)
CK: 197 U/L — ABNORMAL HIGH (ref 26–192)
Troponin-I, QT: <0.02 NG/ML (ref 0.0–0.045)

## 2013-04-09 LAB — PROTHROMBIN TIME + INR
INR: 1 (ref 0.8–1.2)
Prothrombin time: 13.2 s (ref 11.5–15.2)

## 2013-04-09 LAB — TSH 3RD GENERATION: TSH: 2.15 u[IU]/mL (ref 0.40–5.00)

## 2013-04-09 LAB — NT-PRO BNP: NT pro-BNP: 217 PG/ML (ref 0–450)

## 2013-04-09 NOTE — ED Provider Notes (Signed)
HPI Comments: Christine [name] is a 47 y.o. Female who work on the [employer] yesterday presents to the ED C/O Chest Pain and SOB since yesterday afternoon. Patient states that she began with gradual onset of SOB developed first, followed by a dry cough, and later substernal and Left Chest Wall with no radiation; sob; n/v; or diaphoresis. Nonsmoker; no h/o CAD/MI. 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 Patient denies radiation to the Back or Jaw. 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 Patient LMP was 3 weeks ago. She denies trauma; hemoptysis; no Hx PE or DVT; no purulent sputum.     Patient is a 47 y.o. female presenting with shortness of breath. The history is provided by the patient.   Shortness of Breath  Associated symptoms include chest pain. Pertinent negatives include no fever, no headaches, no sore throat, no neck pain, no cough, no wheezing, no vomiting and no abdominal pain.        Past Medical History   Diagnosis Date   ??? Sleep apnea    ??? Anemia         Past Surgical History   Procedure Laterality Date   ??? Hx gastric bypass           Family History   Problem Relation Age of Onset   ??? Heart Disease Maternal Grandmother    ??? Heart Disease Maternal Grandfather         History     Social History   ??? Marital Status: DIVORCED     Spouse Name: N/A     Number of Children: N/A   ??? Years of Education: N/A     Occupational History   ???       Farm-Laborer     Social History Main Topics   ??? Smoking status: Never Smoker    ??? Smokeless tobacco: Not on file   ??? Alcohol Use: No   ??? Drug Use: No   ??? Sexually Active: Not on file     Other Topics Concern   ??? Not on file     Social History Narrative   ??? No narrative on file                  ALLERGIES: Review of patient's allergies indicates no known allergies.      Review of Systems   Constitutional: Negative.  Negative for  fever, chills and diaphoresis.   HENT: Negative.  Negative for congestion, sore throat, trouble swallowing, neck pain and neck stiffness.    Eyes: Negative.  Negative for photophobia, pain and redness.   Respiratory: Positive for shortness of breath. Negative for cough, chest tightness and wheezing.    Cardiovascular: Positive for chest pain. Negative for palpitations.   Gastrointestinal: Negative.  Negative for nausea, vomiting, abdominal pain, diarrhea and blood in stool.   Genitourinary: Negative for dysuria, frequency and difficulty urinating.   Musculoskeletal: Negative.  Negative for myalgias and arthralgias.   Skin: Negative.    Neurological: Negative.  Negative for dizziness, tremors, seizures, syncope, speech difficulty, light-headedness and headaches.   Psychiatric/Behavioral: Negative.  Negative for confusion. The patient is not nervous/anxious.        Filed Vitals:    04/09/13 1547   BP: 141/83   Pulse: 81   Temp: 98.3 ??F (36.8 ??C)  Resp: 18   Height: 5\' 9"  (1.753 m)   Weight: 155.357 kg (342 lb 8 oz)   SpO2: 99%            Physical Exam   Nursing note and vitals reviewed.  Constitutional: No distress.   Obese female   HENT:   Head: Atraumatic.   Right Ear: External ear normal.   Left Ear: External ear normal.   Mouth/Throat: Oropharynx is clear and moist.   Eyes: Conjunctivae and EOM are normal.   Neck: Normal range of motion. Neck supple. No tracheal deviation present.   Cardiovascular: Normal rate, regular rhythm and normal heart sounds.    Pulmonary/Chest: Effort normal and breath sounds normal. No stridor. No respiratory distress. She has no wheezes. She has no rales.   Abdominal: Soft. Bowel sounds are normal. There is no tenderness. There is no rebound and no guarding.   Musculoskeletal: Normal range of motion. She exhibits no edema and no tenderness.   Lymphadenopathy:     She has no cervical adenopathy.   Neurological: She is alert. No cranial nerve deficit. She exhibits normal muscle tone.  Coordination normal.   Skin: Skin is warm and dry. No rash noted. She is not diaphoretic.   Psychiatric: She has a normal mood and affect. Thought content normal.        MDM    Procedures      ------------------------------------------------------------------------------------------------------------------------------------     EKG INTERPRETATIONS:  Normal Sinus, rhythm, Normal ECG, Vent rate-72 bpm, Normal ECG Dr. Amalia Greenhouse, MD      RADIOLOGY RESULTS:   XR CHEST PORT [As per Provider: Dr. Amalia Greenhouse, MD]  ~           LAB RESULTS:  Recent Results (from the past 12 hour(s))   EKG, 12 LEAD, INITIAL    Collection Time     04/09/13  4:01 PM       Result Value Range    Ventricular Rate 72      Atrial Rate 72      P-R Interval 148      QRS Duration 94      Q-T Interval 382      QTC Calculation (Bezet) 418      Calculated P Axis 26      Calculated R Axis -25      Calculated T Axis 36      Diagnosis        Value: Normal sinus rhythm      Normal ECG      No previous ECGs available   PTT    Collection Time     04/09/13  4:15 PM       Result Value Range    aPTT 26.1  24.6 - 37.7 SEC   PROTHROMBIN TIME + INR    Collection Time     04/09/13  4:15 PM       Result Value Range    Prothrombin time 13.2  11.5 - 15.2 sec    INR 1.0  0.8 - 1.2     PRO-BNP    Collection Time     04/09/13  4:15 PM       Result Value Range    NT pro-BNP 217  0 - 450 PG/ML   TSH, 3RD GENERATION    Collection Time     04/09/13  4:15 PM       Result Value Range    TSH 2.15  0.40 - 5.00 uIU/mL  CBC WITH AUTOMATED DIFF    Collection Time     04/09/13  4:15 PM       Result Value Range    WBC 6.5  4.6 - 13.2 K/uL    RBC 4.19 (*) 4.20 - 5.30 M/uL    HGB 9.7 (*) 12.0 - 16.0 g/dL    HCT 16.1 (*) 09.6 - 45.0 %    MCV 76.8  74.0 - 97.0 FL    MCH 23.2 (*) 24.0 - 34.0 PG    MCHC 30.1 (*) 31.0 - 37.0 g/dL    RDW 04.5 (*) 40.9 - 14.5 %    PLATELET 260  135 - 420 K/uL    MPV 11.4  9.2 - 11.8 FL    NEUTROPHILS 71  40 - 73 %    LYMPHOCYTES 19 (*) 21 - 52 %     MONOCYTES 8  3 - 10 %    EOSINOPHILS 2  0 - 5 %    BASOPHILS 0  0 - 2 %    ABS. NEUTROPHILS 4.6  1.8 - 8.0 K/UL    ABS. LYMPHOCYTES 1.2  0.9 - 3.6 K/UL    ABS. MONOCYTES 0.5  0.05 - 1.2 K/UL    ABS. EOSINOPHILS 0.1  0.0 - 0.4 K/UL    ABS. BASOPHILS 0.0  0.0 - 0.06 K/UL    DF AUTOMATED     METABOLIC PANEL, BASIC    Collection Time     04/09/13  4:15 PM       Result Value Range    Sodium 145  136 - 145 mmol/L    Potassium 4.2  3.5 - 5.5 mmol/L    Chloride 112 (*) 100 - 108 mmol/L    CO2 27  21 - 32 mmol/L    Anion gap 6  3.0 - 18 mmol/L    Glucose 80  74 - 99 mg/dL    BUN 14  7.0 - 18 MG/DL    Creatinine 8.11  0.6 - 1.3 MG/DL    BUN/Creatinine ratio 14  12 - 20      GFR est AA >60  >60 ml/min/1.8m2    GFR est non-AA >60  >60 ml/min/1.24m2    Calcium 8.6  8.5 - 10.1 MG/DL   CARDIAC PANEL,(CK, CKMB & TROPONIN)    Collection Time     04/09/13  4:15 PM       Result Value Range    CK 197 (*) 26 - 192 U/L    CK - MB 4.2 (*) 0.5 - 3.6 ng/ml    CK-MB Index 2.1  0.0 - 4.0 %    Troponin-I, Qt. <0.02  0.0 - 0.045 NG/ML   HEPATIC FUNCTION PANEL    Collection Time     04/09/13  4:15 PM       Result Value Range    Protein, total 7.1  6.4 - 8.2 g/dL    Albumin 3.6  3.4 - 5.0 g/dL    Globulin 3.5  2.0 - 4.0 g/dL    A-G Ratio 1.0  0.8 - 1.7      Bilirubin, total 1.0  0.2 - 1.0 MG/DL    Bilirubin, direct 0.3 (*) 0.0 - 0.2 MG/DL    Alk. phosphatase 97  45 - 117 U/L    AST 18  15 - 37 U/L    ALT 20  12.0 - 78.0 U/L   URINALYSIS W/ RFLX MICROSCOPIC    Collection Time  04/09/13  4:15 PM       Result Value Range    Color YELLOW      Appearance CLEAR      Specific gravity >1.030 (*) 1.003 - 1.030    pH (UA) 5.5  5.0 - 8.0      Protein NEGATIVE   NEG mg/dL    Glucose NEGATIVE   NEG mg/dL    Ketone NEGATIVE   NEG mg/dL    Bilirubin SMALL (*) NEG      Blood TRACE (*) NEG      Urobilinogen 0.2  0.2 - 1.0 EU/dL    Nitrites NEGATIVE   NEG      Leukocyte Esterase NEGATIVE   NEG     URINE MICROSCOPIC ONLY    Collection Time     04/09/13  4:15  PM       Result Value Range    WBC 0 to 2  0 - 4 /hpf    RBC 0  0 - 5 /hpf    Epithelial cells FEW  0 - 5 /lpf    Bacteria NEGATIVE   NEG /hpf   CONSULTATIONS:  None      PROGRESS NOTES:  4:03 PM  Dr. Amalia Greenhouse, MD dicussed with patient treatment. Reviewed medications i.e. bottles provided. Answered his or her questions.   Chest pain since yesterday with negative ekg and trop; risk factors low; no clinical dvt and low perc score; cxr neg for pulmonary disease. Likely GI or musculoskeletal. Will tx symptomatically and have pt fu with pcp. Return immediately if any worsening.    ED DIAGNOSES & DISPOSITIONS:   Diagnosis:   1. Atypical chest pain          Disposition: Home    Follow-up Information    Follow up With Details Comments Contact Info    [facility]  Expect a call from [facility] tomorrow [address]  [place] [address]  [phone]          Current Discharge Medication List      START taking these medications    Details   naproxen (NAPROSYN) 500 mg tablet Take 1 Tab by mouth two (2) times daily (with meals) for 10 days.  Qty: 20 Tab, Refills: 0      omeprazole (PRILOSEC) 20 mg capsule Take 1 Cap by mouth daily.  Qty: 30 Cap, Refills: 1               SCRIBE ATTESTATION STATEMENT  Documented by: Elvis Coil (3:54 PM), scribing for and in the presence of Amalia Greenhouse, MD. (3:54 PM)      PROVIDER ATTESTATION STATEMENT  I personally performed the services described in the documentation, reviewed the documentation, as recorded by the scribe in my presence, and it accurately and completely records my words and actions.  Amalia Greenhouse, MD. 5:53 PM      ------------------------------------------------------------------------------------------------------------------------------------

## 2013-04-09 NOTE — ED Notes (Signed)
Patient stated understanding of discharge instructions. Patient was ambulatory upon discharge. Patient received two prescriptions.  Patient armband removed and shredded

## 2013-04-09 NOTE — ED Notes (Signed)
Has been working on farm doing manual labor all week. Not hurts all over, feels SOB and lower legs hurt.

## 2013-04-09 NOTE — ED Notes (Signed)
Patient states she is having chest pain as she is walking back to the room, states she is thinking it is muscular.

## 2013-04-10 LAB — EKG, 12 LEAD, INITIAL
Atrial Rate: 72 {beats}/min
Calculated P Axis: 26 degrees
Calculated R Axis: -25 degrees
Calculated T Axis: 36 degrees
Diagnosis: NORMAL
P-R Interval: 148 ms
Q-T Interval: 382 ms
QRS Duration: 94 ms
QTC Calculation (Bezet): 418 ms
Ventricular Rate: 72 {beats}/min

## 2013-07-03 LAB — METABOLIC PANEL, BASIC
BUN: 16 mg/dl (ref 7–25)
CO2: 25 mEq/L (ref 21–32)
Calcium: 8 mg/dl — ABNORMAL LOW (ref 8.5–10.1)
Chloride: 107 mEq/L (ref 98–107)
Creatinine: 1 mg/dl (ref 0.6–1.3)
GFR est AA: >60
GFR est non-AA: >60
Glucose: 101 mg/dl (ref 74–106)
Potassium: 3.5 mEq/L (ref 3.5–5.1)
Sodium: 139 mEq/L (ref 136–145)

## 2013-07-03 LAB — POC URINE MACROSCOPIC
Bilirubin: NEGATIVE
Blood: NEGATIVE
Glucose: NEGATIVE mg/dl
Ketone: NEGATIVE mg/dl
Leukocyte Esterase: NEGATIVE
Nitrites: NEGATIVE
Protein: NEGATIVE mg/dl
Specific gravity: >=1.03 (ref 1.005–1.030)
Urobilinogen: 0.2 EU/dl (ref 0.0–1.0)
pH (UA): 5.5 (ref 5–9)

## 2013-07-03 LAB — CKMB PROFILE
CK - MB: 3.5 ng/ml (ref 0.0–3.6)
CK-MB Index: 2.5 % (ref 0.0–4.9)
CK: 140 U/L (ref 26–192)

## 2013-07-03 LAB — CBC WITH AUTOMATED DIFF
BASOPHILS: 0.4 % (ref 0–3)
EOSINOPHILS: 2 % (ref 0–5)
HCT: 34 % — ABNORMAL LOW (ref 37.0–50.0)
HGB: 10.8 gm/dl — ABNORMAL LOW (ref 13.0–17.2)
IMMATURE GRANULOCYTES: 0.4 % — ABNORMAL HIGH (ref 0.00–0.00)
LYMPHOCYTES: 26.5 % — ABNORMAL LOW (ref 28–48)
MCH: 24.1 pg — ABNORMAL LOW (ref 25.4–34.6)
MCHC: 31.8 gm/dl (ref 30.0–36.0)
MCV: 75.9 fL — ABNORMAL LOW (ref 80.0–98.0)
MONOCYTES: 7.2 % (ref 1–13)
NEUTROPHILS: 63.5 % (ref 34–64)
NRBC: 0 (ref 0–0)
PLATELET: 187 10*3/uL (ref 140–450)
RBC: 4.48 M/uL (ref 3.60–5.20)
RDW-SD: 59.6 — ABNORMAL HIGH (ref 36.4–46.3)
WBC: 8.1 10*3/uL (ref 4.0–11.0)

## 2013-07-03 LAB — LACTIC ACID: Lactic Acid: 0.7 mmol/L (ref 0.4–2.0)

## 2013-07-03 LAB — MAGNESIUM: Magnesium: 1.6 mg/dl — ABNORMAL LOW (ref 1.8–2.4)

## 2013-07-03 LAB — MYOGLOBIN: Myoglobin: 90 ng/ml — ABNORMAL HIGH (ref 13.0–71.0)

## 2013-07-03 NOTE — ED Provider Notes (Signed)
[facility]  EMERGENCY DEPARTMENT TREATMENT REPORT  NAME:  Christine [name]  SEX:   F  ADMIT: 07/02/2013  DOB:   1966/04/01  MR#    [id number]  ROOM:  EO04  TIME SEEN: 02 36 AM  ACCT#  [id number]        CHIEF  COMPLAINT:  My legs are killing me.    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    She has been driving steadily for the last 3 days [place]   Her legs started to hurt the second day and now are   really hurting a lot, to the  point that she is in excruciating pain in the   calf area.  The patient denies ever having problems like this, just stated her   legs are swollen. It is  bilateral.  The  patient wanted to be evaluated   further for the etiology and treated symptomatically for this.    PAST MEDICAL HISTORY:  The patient has history of iron deficiency anemia, also sleep apnea.      PAST SURGICAL HISTORY:  She has had a gastric bypass.    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    ALLERGIES:  NONE.    MEDICATIONS:  Iron transfusions.    REVIEW OF SYSTEMS:  No fever, no coughing, no shortness of breath, no abdominal pain. No nausea,   vomiting, diarrhea.  No rash.  No numbness or weakness.  No trauma.  No other   trauma.  No fever.  Denies complaints in all other systems.     PHYSICAL EXAMINATION:  VITAL SIGNS:  Blood pressure 137/74, pulse 71, respiration 20, temperature   97.6, pain 8 out of 10, sats 96%.  GENERAL:  The patient is not toxic, not dehydrated.  She appears very   uncomfortable, though.  NECK:  Supple, nontender, symmetrical, no masses or JVD, trachea midline,   thyroid not enlarged, nodular, or tender.   RESPIRATORY:  Clear and equal breath sounds.  No respiratory distress,   tachypnea, or accessory muscle use.     CARDIOVASCULAR:   Heart regular, without murmurs, gallops, rubs, or thrills.    CHEST:  Chest symmetrical without masses or tenderness.   GI:  Abdomen soft, nontender, without complaint of pain to palpation.  No   hepatomegaly or splenomegaly. No abdominal or inguinal masses appreciated by   inspection or palpation.   EXTREMITIES:  Right arm and left arm are not tender or  swollen.  Both legs   show +2 pitting edema, no rash seen.  Good distal sensation, motor strength   and pulses noted.  Has mild bilateral calf tenderness.  NEUROLOGIC:  She is morbidly obese at the weight of  158 kg. PSYCHIATRIC:    Judgment appears appropriate. Recent and remote memory appear to be intact.     INITIAL ASSESSMENT AND MANAGEMENT PLAN:  The patient presents here with having leg edema and tenderness, will  be   evaluated further for DVT, as she has been in a car for a long time. The   patient will be treated symptomatically.  She will be evaluated here.    LABORATORY TESTS:  Urinalysis showed dehydration, negative for infection.  White count 8.1,   hemoglobin and hematocrit of  34 and 11.  Normal CMP.  Magnesium slightly low   at 1.6.  Normal lactic noted.  Myoglobin slightly elevated at 90, normal CPK.    White count 8.1.    COURSE IN THE EMERGENCY DEPARTMENT:  The patient was hydrated with IV fluids, given Dilaudid for pain immediately   because of her significant pain. Given Zofran for nausea.  She has received   magnesium for hypomagnesemia.  She was given  Zofran and another dose of   Dilaudid.  Lovenox was given for her, and she was  placed in the observation   unit tonight for sequential exams and IV treatment, and she will undergo a PVL   in the morning.  She  is much more comfortable. She is going to use her own   CPAP machine.    CLINICAL IMPRESSION:  1.  Acute bilateral leg pain with pitting edema.  2.  Morbid obesity.      ___________________  Smitty Cords MD  Dictated By: Marland Kitchen     My signature above authenticates this document and my orders, the final    diagnosis (es), discharge prescription (s), and instructions in the [facility] record.    If you have any questions please contact [phone].    PB  D:07/03/2013 02:36:14  T: 07/03/2013 03:40:48  [id number]  Authenticated by Smitty Cords, M.D. On 07/05/2013 06:43:56 AM

## 2013-07-03 NOTE — Discharge Summary (Signed)
[facility]  ED Discharge Summary  NAME:  Christine [name], Christine [name]  SEX:   F  DOB: 1966/05/21  MR#    [id number]  ROOM:  EO04  ACCT#  [id number]        PRIMARY CARE DOCTOR:    Dr. Timothy Lasso at urgent care on [facility].     DATE AND TIME OF ASSIGNMENT OF ASSIGNMENT TO THE OBSERVATION UNIT:  07/03/2013 at 12:46 a.m.    DATE AND TIME OF DISPOSITION:  07/03/2013 at 11:30 a.m.    ASSIGNMENT DIAGNOSES:  1.  Left pain.   2.  Pitting edema.    DISPOSITION DIAGNOSES:  1.  Leg pain.  Peripheral vascular laboratory negative.   2.  Pitting edema.    HISTORY OF PRESENT ILLNESS:  The patient had recently, because of work, a long car trip.  For 3 days she   was driving pretty much consistently, although she got out and exercised.  She   came in here having severe pain and cramping as well as edema.  She was   placed in the observation unit for PVL studies.    COURSE IN THE OBSERVATION UNIT:  The patient was given Dilaudid as needed for pain.  She had PVL studies that   were negative for DVT acutely by the PVL tech.    EXAMINATION AT DISPOSITION:  VITAL SIGNS:  Blood pressure 129/77, pulse 65, respirations 18, temperature   98.2, 100% on room air, rating pain at a zero.    EXTREMITIES:  Lower extremities:  She is having some intermittent cramping and   pain in them.  She has normal equal distal pulses, and they are symmetric   without redness, rash or swelling.  NEUROLOGICAL:  She is moving them both equally with normal strength.  GASTROINTESTINAL:  Abdomen is benign, soft and nontender.      DISPOSITION:  I have encouraged her to follow up with her family doctor.  We will give her a   prescription for a few Norco tablets.  She should elevate her legs to reduce   pain and swelling, limit activity based on pain.  The patient was personally   evaluated by myself and Dr. Buddy Duty who agrees with the above assessment and   plan.   The patient is discharged home in stable condition, with instructions    to follow up with their regular doctor.  They are advised to return   immediately for any worsening or symptoms of concern.      ___________________  Lucio Edward MD  Dictated EA:VWUJ Bradd Burner, PA    My signature above authenticates this document and my orders, the final   diagnosis(es), discharge prescription(s) and instructions in the [facility] record.    If you have any questions please contact [phone].    LH  D:07/03/2013 12:42:16  T: 07/03/2013 13:38:41  [id number]  Authenticated by Lucio Edward, MD On 07/04/2013 11:18:41 AM

## 2015-01-19 ENCOUNTER — Emergency Department: Admit: 2015-01-20 | Payer: PRIVATE HEALTH INSURANCE | Primary: Family Medicine

## 2015-01-19 DIAGNOSIS — J069 Acute upper respiratory infection, unspecified: Secondary | ICD-10-CM

## 2015-01-20 ENCOUNTER — Inpatient Hospital Stay
Admit: 2015-01-20 | Discharge: 2015-01-20 | Disposition: A | Discharge status: Home / Self Care | Payer: PRIVATE HEALTH INSURANCE | Attending: Emergency Medicine

## 2015-01-20 MED ORDER — IPRATROPIUM-ALBUTEROL 2.5 MG-0.5 MG/3 ML NEB SOLUTION
2.5 mg-0.5 mg/3 ml | RESPIRATORY_TRACT | Status: AC
Start: 2015-01-20 — End: 2015-01-19
  Administered 2015-01-20: 04:00:00 via RESPIRATORY_TRACT

## 2015-01-20 MED ORDER — ALBUTEROL SULFATE HFA 90 MCG/ACTUATION AEROSOL INHALER
90 mcg/actuation | RESPIRATORY_TRACT | Status: AC | PRN
Start: 2015-01-20 — End: ?

## 2015-01-20 MED FILL — IPRATROPIUM-ALBUTEROL 2.5 MG-0.5 MG/3 ML NEB SOLUTION: 2.5 mg-0.5 mg/3 ml | RESPIRATORY_TRACT | Qty: 3

## 2015-01-20 NOTE — ED Provider Notes (Signed)
[facility]  EMERGENCY DEPARTMENT TREATMENT REPORT  NAME:  Christine LatinoSAIDI, Shalika  SEX:   F  ADMIT: 01/19/2015  DOB:   [birth year]  MR#    [id number]  ROOM:  [id number]  TIME DICTATED: 01 27 AM  ACCT#  [id number]        CHIEF COMPLAINT:  Cough.    HISTORY OF PRESENT ILLNESS:  This is a 49 year old female who presents with complaints of cough. States   that she has been coughing throughout the past week and a half with green   phlegm.  She has had a low-grade fever and complaints of sore throat.  The   patient's daughter is sick with similar symptoms.  No shortness of breath.    The patient does complain of chest pain with coughing and this just began 2   days ago after the patient had been coughing for several days.    REVIEW OF SYSTEMS:  GENERAL:  Positive for low-grade fever.  Negative for chills.  Positive for   malaise.  ENT:  Positive for sore throat.  Negative for ear pain or rhinorrhea.  LUNGS:  Positive for cough with green phlegm production.  Negative for   shortness of breath.  CARDIAC:  Positive for chest pain with cough.  Negative for palpitations.  ABDOMEN:  Negative for abdominal pain, nausea, vomiting, or diarrhea.  SKIN:  Negative for discoloration or rash.  NEUROLOGIC:  Negative for dizziness or syncope.    PAST MEDICAL HISTORY:  Gastric bypass, sleep apnea, anemia.    SURGICAL HISTORY:  Gastric bypass.    SOCIAL HISTORY:  Denies use of tobacco, illicit drugs or alcohol.  The patient's daughter is   sick with similar symptoms, tested negative for strep throat.      PHYSICAL EXAMINATION:   VITAL SIGNS:  Temperature 98.4, heart rate 71, respiratory rate 16, oxygen   saturation 96-100% on room air.  GENERAL:  This is a well-developed, well-nourished female, morbidly obese.  HEENT:  Head is  normocephalic, atraumatic.  Ear canals are clear.  TMs are   pearly gray, no erythema or bulging.  Oropharynx with some mild erythema, no   exudates.  No tonsillar hypertrophy.  No swelling of oropharynx or  deviation   of the uvula.  LUNGS:  With some wheezing and rhonchi heard throughout.  No rales.  No   respiratory distress or labored breathing.  NEUROLOGIC:  Alert and oriented times 3.  SKIN:  Negative for rash or discoloration.    EMERGENCY DEPARTMENT COURSE:  The patient's daughter was recently tested for Strep and is negative, with   diagnosis of upper respiratory infection.  I did order a chest x-ray  for this   patient, as reviewed by myself and the supervising physician, interpreted as   no acute infiltrate or effusion, negative.  The patient was given a breathing   treatment and symptoms are improved.  Oxygen saturation was 96 prior to   treatment, is 100% after. The patient will be discharged with an inhaler,   instructed on its use.    The patient was instructed to follow up with her primary doctor in 2 to 3 days   for reexam, to return with any worsening symptoms such as difficulty   breathing.  I do not suspect any cardiac pathology with the patient's symptoms   of chest pain with the clear history of the pain starting after several days   of coughing, associated with the coughing.  The patient was  advised to return   with worsening symptoms of chest pain.      IMPRESSION:  Upper respiratory infection, viral bronchitis.    DISPOSITION:  The patient is discharged home in stable condition.  Her symptoms are stable   for outpatient management.    The patient was personally evaluated by myself and Dr. Imogene Burn who   agrees with the above assessment and plan.      ___________________  Imogene Burn M.D.  Dictated By: Elveria Rising Winship, PA-C    My signature above authenticates this document and my orders, the final  diagnosis (es), discharge prescription (s), and instructions in the [facility] record.  Nursing notes have been reviewed by the physician/mid-level provider.    If you have any questions please contact [phone].    ST  D:01/20/2015 01:27:01  T: 01/20/2015 08:21:15  [id number]

## 2021-03-02 ENCOUNTER — Emergency Department: Admit: 2021-03-02 | Payer: PRIVATE HEALTH INSURANCE | Primary: Family Medicine

## 2021-03-02 ENCOUNTER — Inpatient Hospital Stay
Admit: 2021-03-02 | Discharge: 2021-03-02 | Disposition: A | Discharge status: Home / Self Care | Payer: PRIVATE HEALTH INSURANCE | Attending: Emergency Medicine

## 2021-03-02 DIAGNOSIS — S82141A Displaced bicondylar fracture of right tibia, initial encounter for closed fracture: Secondary | ICD-10-CM

## 2021-03-02 MED ORDER — HYDROCODONE-ACETAMINOPHEN 5 MG-325 MG TAB
5-325 mg | ORAL | Status: AC
Start: 2021-03-02 — End: 2021-03-02
  Administered 2021-03-02: 23:00:00 via ORAL

## 2021-03-02 MED ORDER — OXYCODONE-ACETAMINOPHEN 5 MG-325 MG TAB
5-325 mg | ORAL_TABLET | Freq: Three times a day (TID) | ORAL | 0 refills | Status: AC | PRN
Start: 2021-03-02 — End: 2021-03-06

## 2021-03-02 MED ORDER — METHOCARBAMOL 750 MG TAB
750 mg | ORAL_TABLET | Freq: Four times a day (QID) | ORAL | 0 refills | Status: AC
Start: 2021-03-02 — End: ?

## 2021-03-02 MED FILL — HYDROCODONE-ACETAMINOPHEN 5 MG-325 MG TAB: 5-325 mg | ORAL | Qty: 1

## 2021-03-02 NOTE — ED Notes (Signed)
I have reviewed discharge instructions with the patient.  The patient verbalized understanding.    Pt knows to get meds from pharm and follow up with ortho

## 2021-03-02 NOTE — ED Provider Notes (Signed)
ED Provider Notes by Erline Hau, PA-C at 03/02/21 1832                Author: Erline Hau, PA-C  Service: EMERGENCY  Author Type: Physician Assistant       Filed: 03/02/21 2004  Date of Service: 03/02/21 1832  Status: Attested           Editor: Erline Hau, PA-C (Physician Assistant)  Cosigner: Smitty Cords, MD at 03/02/21 2203          Attestation signed by Smitty Cords, MD at 03/02/21 2203          I, Smitty Cords, MD , have personally seen and examined this patient; I have fully participated in the care of this patient with the advanced practice provider.   I have reviewed and agree with all pertinent clinical information including history, physical exam, studies and the plan.  I have also reviewed and agree with the medications, allergies and past medical history sections for this patient.      Smitty Cords, MD   Mar 02, 2021   On my evaluation the patient had a contusion right over the area on x-ray that was suspected to have a tibial plateau fracture.  [place]opedist there.  She just had injection in that same joint  a few weeks ago.  She is going to go home and be completely nonweightbearing till she sees the orthopedist.  She will use a wheelchair to get around and lean on a walker as much as she can if needed.  She will pivot without weightbearing on that leg to  avoid progression of the fracture and she understands importance of that.                                 [facility]   Emergency Department Treatment Report          Patient: Christine [name]  Age: 55 y.o.  Sex: female          Date of Birth: 06/13/1966  Admit Date: 03/02/2021  PCP: Sol Blazing, MD     MRN: [id number]   CSN: [id number]   Attending: Smitty Cords         Room: [id number]  Time Dictated: 6:32 PM  PA: [facility]        Chief Complaint         Chief Complaint       Patient presents with        ?  Fall        ?  Knee Pain           History of Present Illness         55 y.o. female is here with knee  pain after a fall.  Right knee hurts more than left knee.  She has an abrasion on the right knee.  She was walking at [employer] when she slipped because the floor was wet.  She was able to get up.  She went to the bathroom and started crying.   She then called her daughter to tell her what was going on.  Few minutes later her knee started hurting.  They both hurt.  The right knee hurts more than the left.  Her shoulders also hurt.  Her left cheek also hurts.  He did not pass out.  She did not  have dizziness.  She states she does feel a little nauseous and she is very upset and she is crying.  Pain is localized to the areas of injury.  Pain does not radiate.  Pain is mild but exacerbated with movement.  She states she cannot put weight on the  right leg.  She is able to stand up and put weight on the left leg.        Review of Systems        Review of Systems    Constitutional: Negative for chills and fever.    HENT: Negative for congestion, ear pain and sore throat.     Respiratory: Negative for cough, sputum production, shortness of breath and wheezing.     Cardiovascular: Negative for chest pain, palpitations and leg swelling.    Gastrointestinal: Negative for abdominal pain, nausea and vomiting.    Musculoskeletal: Negative for back pain, joint pain and neck pain.    Skin: Negative for rash.    Neurological: Negative for dizziness, loss of consciousness, weakness and headaches.            Past Medical/Surgical History          Past Medical History:        Diagnosis  Date         ?  Anemia       ?  Diabetes (HCC)       ?  Hypertension       ?  Neuropathy       ?  Sleep apnea           ?  Sleep apnea            Past Surgical History:         Procedure  Laterality  Date          ?  HX GASTRIC BYPASS              ?  HX GYN                 Social History          Social History          Socioeconomic History         ?  Marital status:  SINGLE       Tobacco Use         ?   Smoking status:  Never Smoker       Substance and Sexual Activity         ?  Alcohol use:  No         ?  Drug use:  No             Family History          Family History         Problem  Relation  Age of Onset          ?  Heart Disease  Maternal Grandmother            ?  Heart Disease  Maternal Grandfather               Current Medications          Current Outpatient Medications          Medication  Sig  Dispense  Refill           ?  oxyCODONE-acetaminophen (Percocet) 5-325 mg per tablet  Take 1 Tablet by mouth every eight (8) hours as needed for Pain for up to 4 days. Max Daily Amount: 3 Tablets.  12 Tablet  0     ?  methocarbamoL (ROBAXIN) 750 mg tablet  Take 1 Tablet by mouth four (4) times daily.  12 Tablet  0           ?  albuterol (PROVENTIL HFA, VENTOLIN HFA, PROAIR HFA) 90 mcg/actuation inhaler  Take 2 Puffs by inhalation every four (4) hours as needed for Wheezing.  1 Inhaler  0           ?  omeprazole (PRILOSEC) 20 mg capsule  Take 1 Cap by mouth daily.  30 Cap  1             Allergies        No Known Allergies        Physical Exam          ED Triage Vitals [03/02/21 1722]     ED Encounter Vitals Group           BP  120/64        Pulse (Heart Rate)  94        Resp Rate  18        Temp  98 ??F (36.7 ??C)        Temp src          O2 Sat (%)  98 %        Weight             Height          Physical Exam   Vitals reviewed.    Constitutional:        General: She is not in acute distress.     Appearance: Normal appearance.    HENT:       Head: Normocephalic and atraumatic.      Mouth/Throat:      Mouth: Mucous membranes are moist.      Comments: Patient also states she has tenderness over left cheek.  No bruising  on that area noted.  EOMIs are normal   Eyes:       Extraocular Movements: Extraocular movements intact.      Conjunctiva/sclera: Conjunctivae normal.      Pupils: Pupils are equal, round, and reactive to light.   Neck:       Comments: No midline tenderness  Cardiovascular:       Rate and Rhythm: Normal  rate and regular rhythm.      Pulses: Normal pulses.      Heart sounds: Normal heart sounds.    Pulmonary:       Effort: Pulmonary effort is normal. No respiratory distress.      Breath sounds: Normal breath sounds.    Abdominal:      General: Abdomen is flat. Bowel sounds are normal.      Palpations: Abdomen is soft.      Tenderness: There is no abdominal tenderness.     Musculoskeletal:          General: Normal range of motion.      Cervical back: Normal range of motion and neck supple. No tenderness.      Comments: Patient is able to stand up but she placed all her weight  on the left leg.  She is unable to place weight on the right leg.  She has pain all over both knees but more pain on the  right knee.    She states her shoulders are also sore.  However she has full range of motion in both shoulders.  He has mild  tenderness on right deltoid muscle but no bony tenderness on shoulders.  Extremities have equal strength bilaterally.    Skin:      General: Skin is warm and dry.   Neurological :       General: No focal deficit present.      Mental Status: She is alert and oriented to person, place, and time.    Psychiatric:         Mood and Affect: Mood normal.         Behavior: Behavior normal.            Impression and Management Plan     Lateral knee pain.  She is able to stand up.  She has more pain on the right knee.  She cannot put weight on that right knee.  X-rays ordered      DDX considered but not limited to fracture, arthritis, sprain, strain        Diagnostic Studies        Lab:    No results found for this or any previous visit (from the past 12 hour(s)).      Imaging:     XR KNEE RT MIN 4 V      Result Date: 03/02/2021   Clinical history: Knee pain, fall EXAMINATION: 4 views right knee FINDINGS: Small joint effusion. Narrowing of the medial knee compartment with near complete loss of joint space. Narrowing of the patellofemoral joint and lateral knee compartment. Subchondral  sclerosis and marginal  osteophytes. Subtle fracture lateral tibial plateau.       IMPRESSION: 1. Small joint effusion. 2. Degenerative changes right knee. 3. Subtle fracture lateral tibial plateau.         Medical Decision Making/ED Course     X-rays as interpreted by me shows a small joint effusion and a subtle fracture on the lateral tibial plateau.   Tibial plateau fracture may be chronic but difficult to know with no previous x-rays.  Patient states that she has had problems with that knee in the past and that she received a knee joint injection for severe arthritis 4 days ago.  [place]   I explained results to her.  A copy of her x-rays were given to her by radiology and a CD.  She needs to follow-up with her orthopedic.  Patient understood.   Called in 12 Percocet into the pharmacy at [facility] which is open 24 hours.  Patient has a wheelchair and a walker at home.  She is unable to use crutches due to obesity.  She we are unable to give her a knee immobilizer due to obese body habitus.  Patient  understands that she should not place any weight on that knee.          Final Diagnosis                 ICD-10-CM  ICD-9-CM          1.  Closed fracture of right tibial plateau, initial encounter   S82.141A  823.00          2.  Abrasion   T14.8XXA  919.0          3.  Knee effusion, right   M25.461  719.06  4.  Fall, initial encounter   [id number].Lorne Skeens  L892.1          Disposition        Patient is discharge home in stable condition with instructions to follow up with their regular doctor. They are advised to return immediately for any new/worsening symptoms or concerns.       Patient evaluated by myself and Dr. Arvella Merles, Rolm Gala H who agrees with the above assessment and plan.       Orlean Patten, PA-C   Mar 02, 2021      *Portions of this electronic record were dictated using Dragon voice recognition software.  Unintended errors in translation may occur.      My signature above authenticates this document and my  orders, the final     diagnosis (es), discharge prescription (s), and instructions in the Epic     record.   If you have any questions please contact [phone].       Nursing notes have been reviewed by the physician/ advanced practice     Clinician.

## 2021-03-02 NOTE — ED Notes (Signed)
Given an x-ray CD

## 2021-03-02 NOTE — ED Notes (Signed)
Spoke to x-ray they said they will make a CD

## 2021-03-02 NOTE — ED Notes (Signed)
Pt arrived via EMS from [place] with reports of falling. C/o right knee pain

## 2021-03-02 NOTE — ED Notes (Signed)
Pt said her R knee, L knee, R shoulder and head hurts.     Pt says she does not remember hitting her head.

## 2021-06-11 ENCOUNTER — Ambulatory Visit
Admission: RE | Admit: 2021-06-11 | Discharge: 2021-06-11 | Disposition: A | Discharge status: Home / Self Care | Payer: No Typology Code available for payment source | Source: Ambulatory Visit | Attending: Orthopedic Surgery | Admitting: Orthopedic Surgery

## 2021-06-11 ENCOUNTER — Other Ambulatory Visit: Payer: Self-pay | Admitting: Orthopedic Surgery

## 2021-06-11 ENCOUNTER — Other Ambulatory Visit: Payer: Self-pay

## 2021-06-11 DIAGNOSIS — M79671 Pain in right foot: Secondary | ICD-10-CM

## 2022-06-15 ENCOUNTER — Emergency Department (HOSPITAL_COMMUNITY)
Admission: EM | Admit: 2022-06-15 | Discharge: 2022-06-16 | Disposition: A | Discharge status: Home / Self Care | Payer: Medicaid Other | Attending: Emergency Medicine | Admitting: Emergency Medicine

## 2022-06-15 ENCOUNTER — Other Ambulatory Visit: Payer: Self-pay

## 2022-06-15 ENCOUNTER — Encounter (HOSPITAL_COMMUNITY): Payer: Self-pay

## 2022-06-15 DIAGNOSIS — S51812A Laceration without foreign body of left forearm, initial encounter: Secondary | ICD-10-CM | POA: Diagnosis not present

## 2022-06-15 DIAGNOSIS — Y9239 Other specified sports and athletic area as the place of occurrence of the external cause: Secondary | ICD-10-CM | POA: Diagnosis not present

## 2022-06-15 DIAGNOSIS — S59912A Unspecified injury of left forearm, initial encounter: Secondary | ICD-10-CM | POA: Diagnosis present

## 2022-06-15 DIAGNOSIS — S90111A Contusion of right great toe without damage to nail, initial encounter: Secondary | ICD-10-CM | POA: Insufficient documentation

## 2022-06-15 DIAGNOSIS — W1839XA Other fall on same level, initial encounter: Secondary | ICD-10-CM | POA: Diagnosis not present

## 2022-06-15 DIAGNOSIS — Z23 Encounter for immunization: Secondary | ICD-10-CM | POA: Insufficient documentation

## 2022-06-15 DIAGNOSIS — W19XXXA Unspecified fall, initial encounter: Secondary | ICD-10-CM

## 2022-06-15 HISTORY — DX: Type 2 diabetes mellitus without complications: E11.9

## 2022-06-15 HISTORY — DX: Anxiety disorder, unspecified: F41.9

## 2022-06-15 HISTORY — DX: Essential (primary) hypertension: I10

## 2022-06-15 NOTE — ED Triage Notes (Signed)
Pt had a fall at the [facility] injuring her left forearm and right great toe.

## 2022-06-16 ENCOUNTER — Emergency Department (HOSPITAL_COMMUNITY): Payer: Medicaid Other

## 2022-06-16 MED ORDER — TETANUS-DIPHTH-ACELL PERTUSSIS 5-2.5-18.5 LF-MCG/0.5 IM SUSY
0.5000 mL | PREFILLED_SYRINGE | Freq: Once | INTRAMUSCULAR | Status: AC
  Administered 2022-06-16: 0.5 mL via INTRAMUSCULAR
  Filled 2022-06-16: qty 0.5

## 2022-06-16 NOTE — Discharge Instructions (Signed)
Your x-rays showed no evidence of acute fracture or dislocation. I suspect that when you hit your arm you likely hit your ulnar nerve which runs along that side of your arm this is likely why you had this severe pain and numbness and tingling and should resolve in the next 24 to 72 hours.  Apply ice to the affected area to reduce swelling.  It feels as though you have a hematoma which is a deep bruise and collection of blood in this area which could be surrounding the nerve and causing your pain. There is no evidence that your toe is broken or dislocated. Use Tylenol Motrin and ice on the affected areas.  To help right away if you have any new or worsening conditions.

## 2022-06-16 NOTE — ED Provider Notes (Signed)
[facility]-EMERGENCY DEPT Provider Note   CSN: [id number] Arrival date & time: 06/15/22  2202     History  Chief Complaint  Patient presents with   Phyllis Mueller    Phyllis Mueller is a 56 y.o. female.  Who presents emergency department chief complaint of fall.  Patient states that she had a mechanical fall [place] [facility]  She has small cut to the left arm.  She is to her for her last tetanus vaccination.  She did have some numbness and tingling earlier in her fingers which has resolved.  She also complains that she hit her toe.  She has chronic oral neuralgia and states that her feeling is decreased in that area and she went to make sure is not fractured.  She did not hit her head or lose consciousness.   Fall       Home Medications Prior to Admission medications   Not on File      Allergies    Patient has no allergy information on record.    Review of Systems   Review of Systems  Physical Exam Updated Vital Signs BP 128/85 (BP Location: Right Arm)   Pulse 87   Temp 98.1 F (36.7 C) (Oral)   Resp 20   Ht 5\' 8"  (1.727 m)   Wt (!) 152.4 kg   SpO2 97%   BMI 51.09 kg/m  Physical Exam Vitals and nursing note reviewed.  Constitutional:      General: She is not in acute distress.    Appearance: She is well-developed. She is not diaphoretic.  HENT:     Head: Normocephalic and atraumatic.     Right Ear: External ear normal.     Left Ear: External ear normal.     Nose: Nose normal.     Mouth/Throat:     Mouth: Mucous membranes are moist.  Eyes:     General: No scleral icterus.    Conjunctiva/sclera: Conjunctivae normal.  Cardiovascular:     Rate and Rhythm: Normal rate and regular rhythm.     Heart sounds: Normal heart sounds. No murmur heard.    No friction rub. No gallop.  Pulmonary:     Effort: Pulmonary effort is normal. No respiratory distress.     Breath sounds: Normal breath sounds.  Abdominal:     General:  Bowel sounds are normal. There is no distension.     Palpations: Abdomen is soft. There is no mass.     Tenderness: There is no abdominal tenderness. There is no guarding.  Musculoskeletal:     Cervical back: Normal range of motion.     Comments: Able to wiggle fingers, small cut to the left arm over the ulna, normal ipsilateral elbow and shoulder exam Right great toe with mild bruising, wiggles toes cap refill less than 2 seconds  Skin:    General: Skin is warm and dry.  Neurological:     Mental Status: She is alert and oriented to person, place, and time.  Psychiatric:        Behavior: Behavior normal.     ED Results / Procedures / Treatments   Labs (all labs ordered are listed, but only abnormal results are displayed) Labs Reviewed - No data to display  EKG None  Radiology No results found.  Procedures Procedures    Medications Ordered in ED Medications  Tdap (BOOSTRIX) injection 0.5 mL (has no administration in time range)    ED Course/  Medical Decision Making/ A&P                           Medical Decision Making 56 year old female with mechanical fall.  I ordered and visualized a left forearm and left foot x-ray which showed no acute fractures.  I suspect patient likely hit her ulnar nerve when she slammed her forearm on the bench.  She may also have hematoma around this area but has had improvement in the numbness and tingling in her hand.  I suspect that this will likely resolve in the next few days.  Otherwise she appears appropriate for discharge.  Tylenol ice and supportive care recommended at discharge.  Discussed outpatient follow-up and return precautions.  Tdap updated.  Amount and/or Complexity of Data Reviewed Radiology: ordered and independent interpretation performed.  Risk Prescription drug management.           Final Clinical Impression(s) / ED Diagnoses Final diagnoses:  Fall, initial encounter    Rx / DC Orders ED Discharge Orders      None         Arthor Captain, PA-C 06/16/22 0327    Sloan Leiter, DO 06/24/22 [phone]

## 2023-07-27 IMAGING — CT CT FOOT*R* W/O CM
3 series · 13 of 35 positions shown, 16 images · non-contrast
Comparison: None.

CLINICAL DATA: Right foot pain since fall last [REDACTED].

EXAM:
CT OF THE RIGHT FOOT WITHOUT CONTRAST
TECHNIQUE: Multidetector CT imaging of the right foot was performed according
to the standard protocol. Multiplanar CT image reconstructions were
also generated.

[Series 5: sfov lower extremity right foot 2.00 br40 s3 soft · axial · 0.55mm/px · z∈[+519,+645]mm · 5 of 91 slices shown, 7 images (1 of 3)]
[im 14/91  soft-tissue]
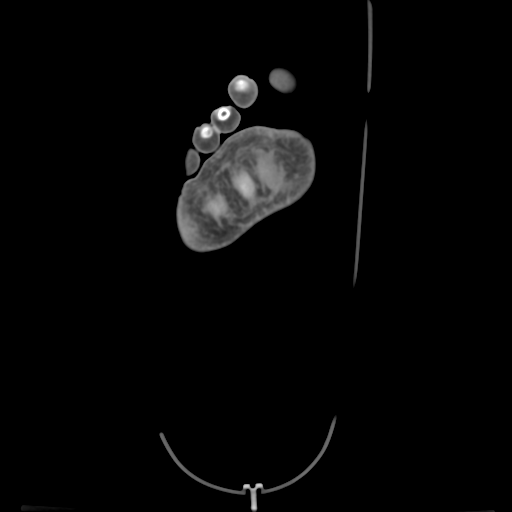
[im 14/91  bone]
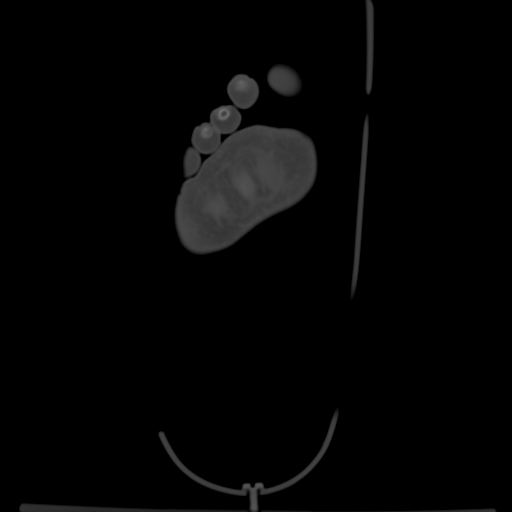
[im 28/91  bone]
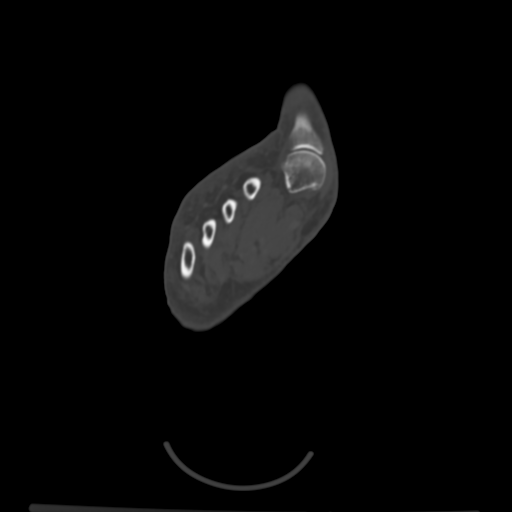
[im 49/91  bone]
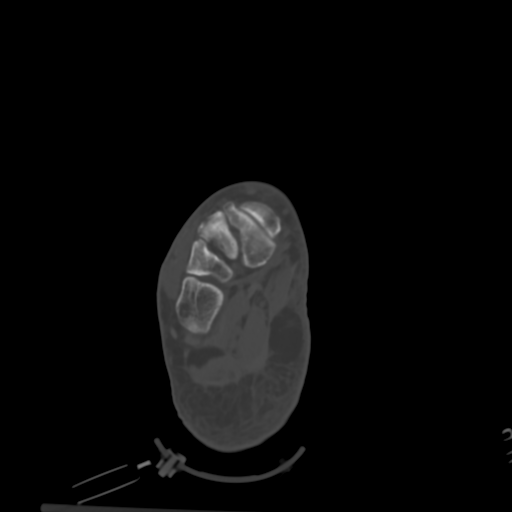
[im 63/91  bone]
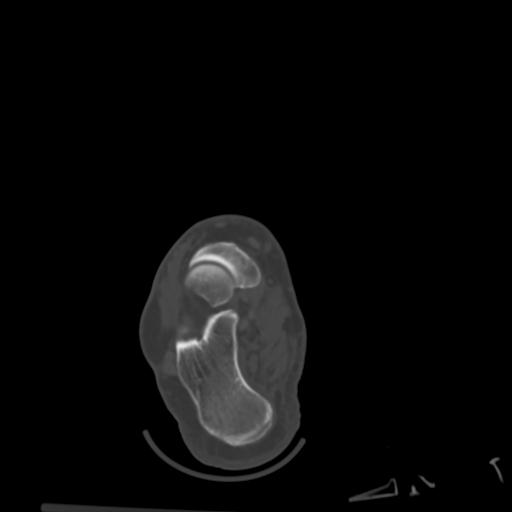
[im 77/91  soft-tissue]
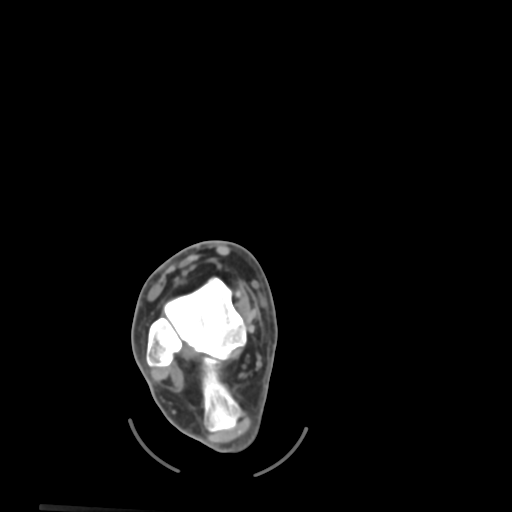
[im 77/91  bone]
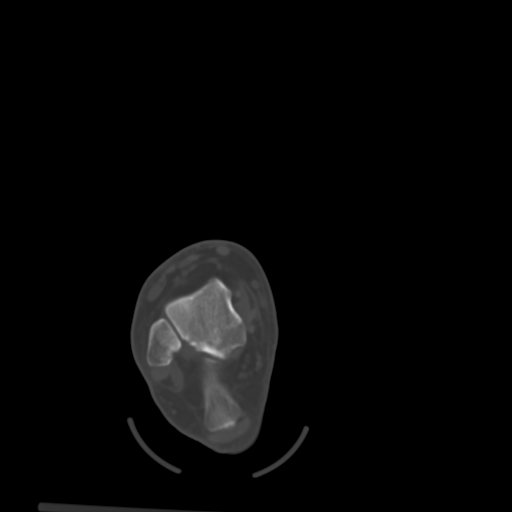

[Series 9: sfov lower extremity right foot 2.00 br40 s3 soft · coronal · 0.36mm/px · 3 of 139 slices shown (2 of 3)]
[im 56/139  bone]
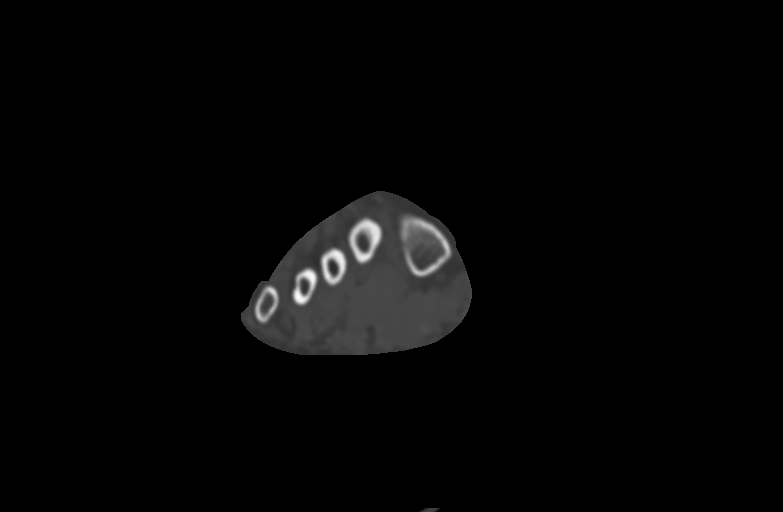
[im 65/139  bone]
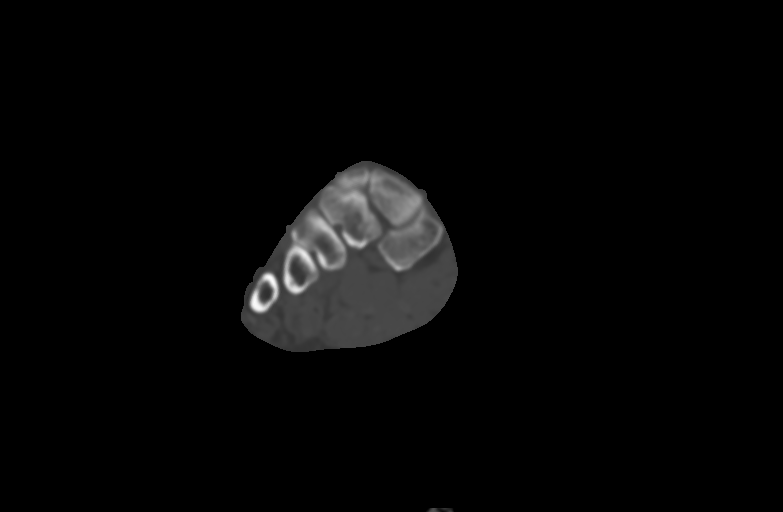
[im 74/139  bone]
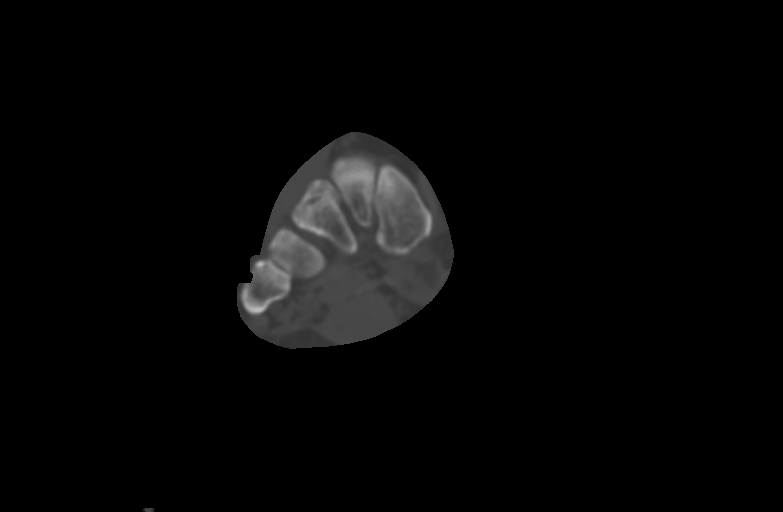

[Series 13: sfov lower extremity right foot 2.00 br40 s3 soft · sagittal · 0.36mm/px · 5 of 61 slices shown, 6 images (3 of 3)]
[im 21/61  bone]
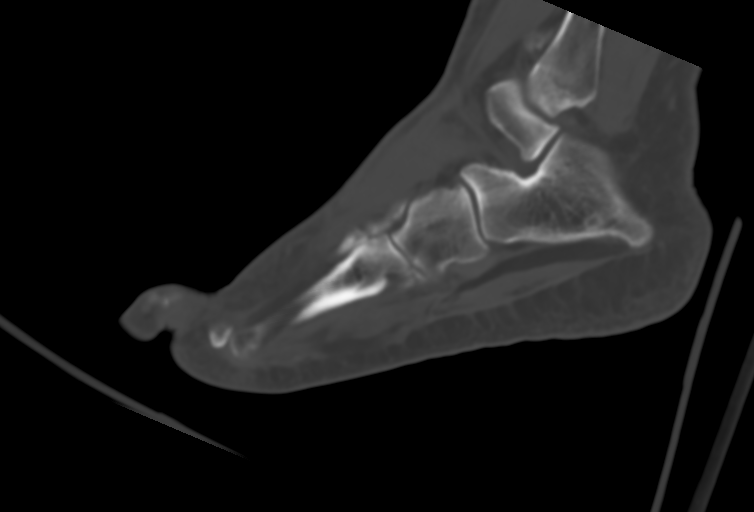
[im 26/61  bone]
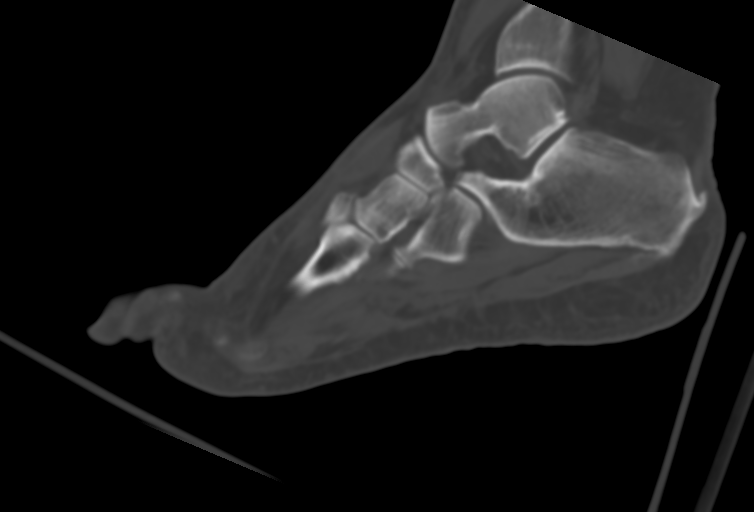
[im 31/61  soft-tissue]
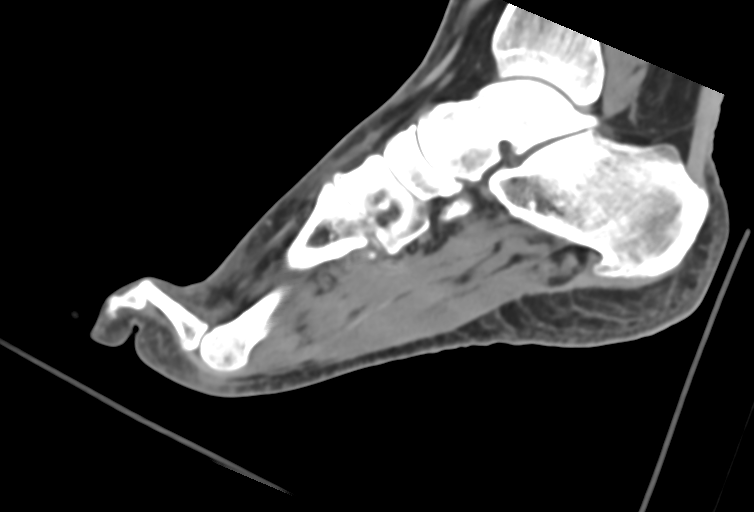
[im 31/61  bone]
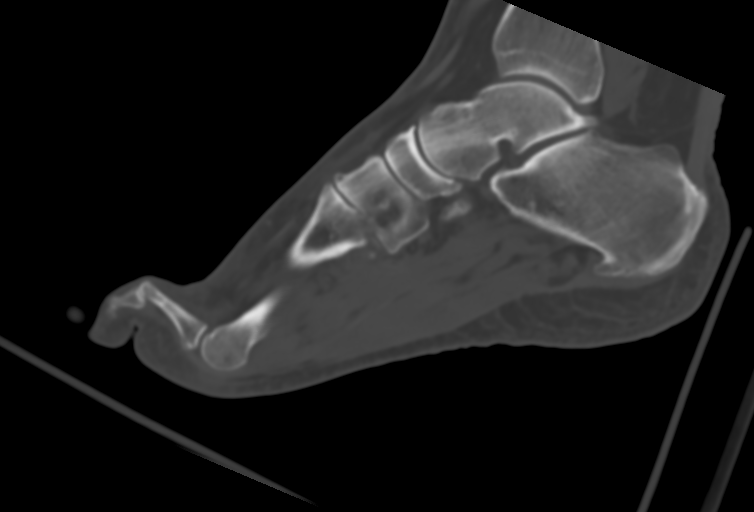
[im 36/61  bone]
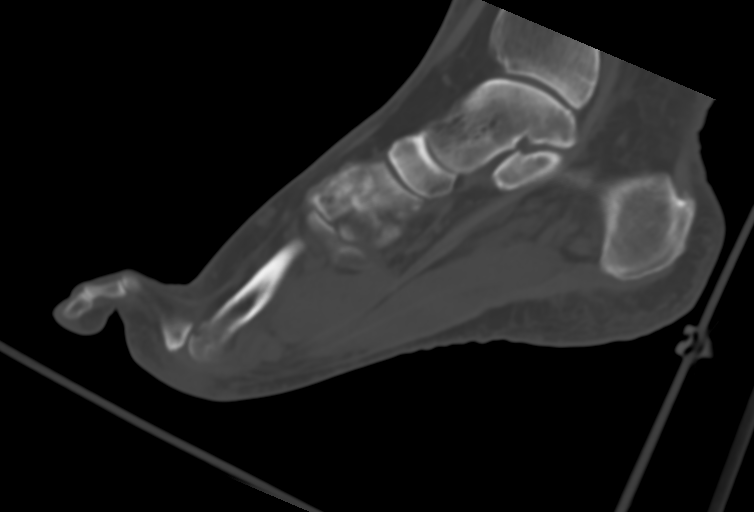
[im 41/61  bone]
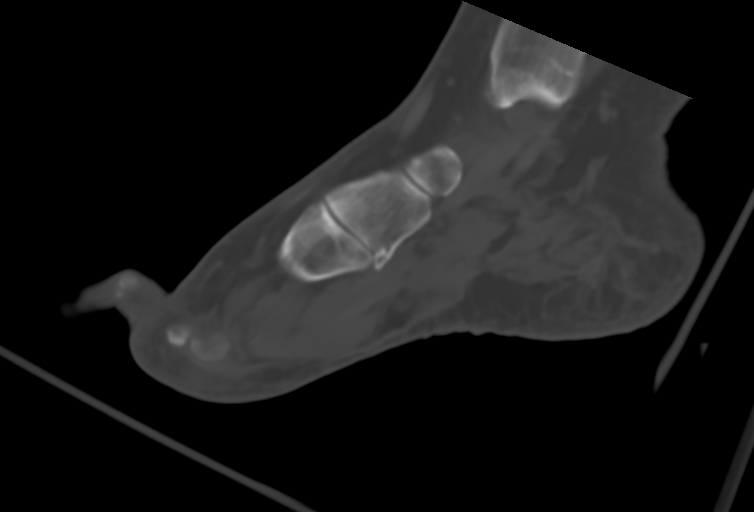

[13 of 35 positions shown; findings below may reference images not displayed]

FINDINGS: Bones/Joint/Cartilage

Acute comminuted nondisplaced intra-articular fracture of the first
proximal phalanx head. No additional fracture. No dislocation. Mild
osteoarthritis of the first and second TMT joints. No joint
effusion.

Ligaments

Ligaments are suboptimally evaluated by CT.

Muscles and Tendons
Grossly intact.

Soft tissue
Soft tissue swelling of the great toe. No fluid collection or
hematoma. No soft tissue mass.
IMPRESSION: 1. Acute comminuted nondisplaced intra-articular fracture of the
first proximal phalanx head.
